# Patient Record
Sex: Male | Born: 1995 | Race: Black or African American | Hispanic: No | Marital: Single | Smoking: Current some day smoker
Health system: Southern US, Community
[De-identification: ages and names within clinical notes are randomized; demographics above are authoritative.]

## PROBLEM LIST (undated history)

## (undated) DIAGNOSIS — Z9109 Other allergy status, other than to drugs and biological substances: Secondary | ICD-10-CM

## (undated) DIAGNOSIS — J302 Other seasonal allergic rhinitis: Secondary | ICD-10-CM

## (undated) DIAGNOSIS — J45909 Unspecified asthma, uncomplicated: Secondary | ICD-10-CM

---
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
## 2017-03-20 ENCOUNTER — Encounter (HOSPITAL_COMMUNITY): Payer: Self-pay | Admitting: Family Medicine

## 2017-03-20 ENCOUNTER — Ambulatory Visit (HOSPITAL_COMMUNITY)
Admission: EM | Admit: 2017-03-20 | Discharge: 2017-03-20 | Disposition: A | Discharge status: Home / Self Care | Payer: Medicaid Other | Attending: Family Medicine | Admitting: Family Medicine

## 2017-03-20 DIAGNOSIS — L03112 Cellulitis of left axilla: Secondary | ICD-10-CM

## 2017-03-20 DIAGNOSIS — L02412 Cutaneous abscess of left axilla: Secondary | ICD-10-CM

## 2017-03-20 MED ORDER — SULFAMETHOXAZOLE-TRIMETHOPRIM 800-160 MG PO TABS: 2.0000 | ORAL_TABLET | Freq: Two times a day (BID) | ORAL | 0 refills | Status: AC

## 2017-03-20 NOTE — ED Provider Notes (Signed)
[facility]    CSN: [id number] Arrival date & time: 03/20/17  1000     History   Chief Complaint Chief Complaint  Patient presents with  . Abscess    HPI Stephen Pennington is a 21 y.o. male.    Patient is a 21 yo M who presents to urgent care with an L underarm abscess. States started off as a small bump in L underarm area 1 week ago that he thought was from irritation from his deodorant. He states he manually popped it at home and it drained a lot of pus at the time. However has become bigger and more painful over the last 4-5 days. No fever/chills, no nausea/vomiting. Otherwise has been doing well. Used to shave but has not in a long time.       Past Medical History:  Diagnosis Date  . Asthma   . Pollen allergies   . Seasonal allergies     Patient Active Problem List   Diagnosis Date Noted  . Viral syndrome 02/18/2013    History reviewed. No pertinent surgical history.     Home Medications    Prior to Admission medications   Medication Sig Start Date End Date Taking? Authorizing Provider  ondansetron (ZOFRAN ODT) 4 MG disintegrating tablet Take 1 tablet (4 mg total) by mouth every 8 (eight) hours as needed for nausea or vomiting. 08/24/13   Viviano Simasobinson, Lauren, NP    Family History History reviewed. No pertinent family history.  Social History Social History  Substance Use Topics  . Smoking status: Never Smoker  . Smokeless tobacco: Not on file  . Alcohol use No     Allergies   Patient has no known allergies.   Review of Systems Review of Systems  Constitutional: Negative for chills and fever.  Respiratory: Negative for shortness of breath.   Cardiovascular: Negative for chest pain.  Gastrointestinal: Negative for abdominal pain, diarrhea, nausea and vomiting.  Skin: Negative for rash.       Abscess in L underarm area     Physical Exam Triage Vital Signs ED Triage Vitals  Enc Vitals Group     BP 03/20/17 1033 118/63     Pulse  Rate 03/20/17 1033 77     Resp 03/20/17 1033 18     Temp 03/20/17 1033 98.6 F (37 C)     Temp src --      SpO2 03/20/17 1033 97 %     Weight --      Height --      Head Circumference --      Peak Flow --      Pain Score 03/20/17 1032 6     Pain Loc --      Pain Edu? --      Excl. in GC? --    No data found.   Updated Vital Signs BP 118/63   Pulse 77   Temp 98.6 F (37 C)   Resp 18   SpO2 97%   Visual Acuity Right Eye Distance:   Left Eye Distance:   Bilateral Distance:    Right Eye Near:   Left Eye Near:    Bilateral Near:     Physical Exam  Constitutional: He is oriented to person, place, and time. He appears well-developed and well-nourished. No distress.  HENT:  Head: Normocephalic and atraumatic.  Eyes: Conjunctivae are normal.  Pulmonary/Chest: Effort normal.  Musculoskeletal: Normal range of motion.  Neurological: He is alert and oriented to person,  place, and time.  Skin: Capillary refill takes less than 2 seconds.  L axilla with firm 3cm area of erythema and warmth, no area of fluctuance, central open draining wound  Psychiatric: He has a normal mood and affect.     UC Treatments / Results  Labs (all labs ordered are listed, but only abnormal results are displayed) Labs Reviewed - No data to display  EKG  EKG Interpretation None       Radiology No results found.  Procedures .Marland KitchenIncision and Drainage Date/Time: 03/20/2017 11:11 AM Performed by: Leland Her Authorized by: Mardella Layman   Consent:    Consent obtained:  Verbal   Consent given by:  Patient   Risks discussed:  Bleeding, incomplete drainage, infection and pain   Alternatives discussed:  No treatment Location:    Type:  Abscess   Size:  3cm   Location: L axilla. Pre-procedure details:    Skin preparation:  Betadine Anesthesia (see MAR for exact dosages):    Anesthesia method:  Local infiltration   Local anesthetic:  Lidocaine 1% w/o epi Procedure type:     Complexity:  Simple Procedure details:    Needle aspiration: no     Incision types:  Stab incision   Scalpel blade:  11   Wound management:  Probed and deloculated   Drainage:  Bloody   Drainage amount:  Scant   Wound treatment:  Wound left open   Packing materials:  None Post-procedure details:    Patient tolerance of procedure:  Tolerated well, no immediate complications   (including critical care time)  Medications Ordered in UC Medications - No data to display   Initial Impression / Assessment and Plan / UC Course  I have reviewed the triage vital signs and the nursing notes.  Pertinent labs & imaging results that were available during my care of the patient were reviewed by me and considered in my medical decision making (see chart for details).   Patient is a 21 yo M who presented with a L axilla abscess that he manually expressed at home. He has had no systemic symptoms and is afebrile and well appearing on exam. Abscess has area of surrounding cellulitis with erythema and warmth. Incision and drainage was attempted but only very scant bloody drainage was expressed. Given history of purulent discharge at home will treat with bactrim for surrounding cellulitis for 7 days. Patient instructed on care after at home and given return precautions including but not limited to poor healing, fever/chills, worsening infection. Patient voiced good understanding.  Final Clinical Impressions(s) / UC Diagnoses   Final diagnoses:  Abscess of left axilla  Cellulitis of left axilla    New Prescriptions New Prescriptions   SULFAMETHOXAZOLE-TRIMETHOPRIM (BACTRIM DS,SEPTRA DS) 800-160 MG TABLET    Take 2 tablets by mouth 2 (two) times daily.      Leland Her, DO 03/20/17 1116

## 2017-03-20 NOTE — Discharge Instructions (Signed)
Keep the area clean and dry, wash with soap and water. Take antibiotic bactrim twice a day for next 7 days. You can take over the counter tylenol or ibuprofen for pain.

## 2017-03-20 NOTE — ED Triage Notes (Signed)
Pt here for abscess under left axilla x 1 week. Open and draining.

## 2017-05-05 ENCOUNTER — Other Ambulatory Visit: Payer: Self-pay

## 2017-05-05 ENCOUNTER — Ambulatory Visit (HOSPITAL_COMMUNITY)
Admission: EM | Admit: 2017-05-05 | Discharge: 2017-05-05 | Disposition: A | Discharge status: Home / Self Care | Payer: Self-pay | Attending: Physician Assistant | Admitting: Physician Assistant

## 2017-05-05 ENCOUNTER — Encounter (HOSPITAL_COMMUNITY): Payer: Self-pay | Admitting: Emergency Medicine

## 2017-05-05 DIAGNOSIS — L0291 Cutaneous abscess, unspecified: Secondary | ICD-10-CM

## 2017-05-05 MED ORDER — SULFAMETHOXAZOLE-TRIMETHOPRIM 800-160 MG PO TABS: 1.0000 | ORAL_TABLET | Freq: Two times a day (BID) | ORAL | 0 refills | Status: AC

## 2017-05-05 MED ORDER — MUPIROCIN CALCIUM 2 % EX CREA: 1.0000 "application " | TOPICAL_CREAM | Freq: Two times a day (BID) | CUTANEOUS | 0 refills | Status: DC

## 2017-05-05 NOTE — ED Triage Notes (Signed)
Per pt he noticed a new boil on his upper left leg. Per pt he has another boil on his bottom, per pt he noticed the newest boil 3 days ago.

## 2017-05-05 NOTE — ED Provider Notes (Signed)
[facility]    CSN: [id number] Arrival date & time: 05/05/17  1238     History   Chief Complaint Chief Complaint  Patient presents with  . Recurrent Skin Infections    HPI Stephen Pennington is a 21 y.o. male.   21 year-old male, presenting today due to multiple skin lesions States he has noticed "white heads" to his right arm, leg leg and right buttock starting in the past several days. He attempted to pop the one on his left leg and now has surrounding redness. No fever or chills   The history is provided by the patient.  Rash  Location:  Shoulder/arm and leg Shoulder/arm rash location:  R forearm Leg rash location:  L upper leg Quality: painful and redness   Pain details:    Quality:  Aching   Severity:  Mild   Onset quality:  Gradual   Duration:  5 days   Timing:  Constant   Progression:  Unchanged Severity:  Mild Onset quality:  Gradual Duration:  5 days Timing:  Constant Progression:  Unchanged Chronicity:  New Context: not animal contact, not chemical exposure, not diapers, not eggs, not exposure to similar rash and not food   Relieved by:  Nothing Worsened by:  Nothing Ineffective treatments:  None tried Associated symptoms: no abdominal pain, no diarrhea, no fatigue, no fever, no headaches, no hoarse voice, no joint pain, no shortness of breath, no sore throat and not vomiting     Past Medical History:  Diagnosis Date  . Asthma   . Pollen allergies   . Seasonal allergies     Patient Active Problem List   Diagnosis Date Noted  . Viral syndrome 02/18/2013    History reviewed. No pertinent surgical history.     Home Medications    Prior to Admission medications   Medication Sig Start Date End Date Taking? Authorizing Provider  mupirocin cream (BACTROBAN) 2 % Apply 1 application topically 2 (two) times daily. 05/05/17   Puanani Gene C, PA-C  ondansetron (ZOFRAN ODT) 4 MG disintegrating tablet Take 1 tablet (4 mg total) by mouth every  8 (eight) hours as needed for nausea or vomiting. 08/24/13   Viviano Simasobinson, Lauren, NP  sulfamethoxazole-trimethoprim (BACTRIM DS,SEPTRA DS) 800-160 MG tablet Take 1 tablet by mouth 2 (two) times daily for 7 days. 05/05/17 05/12/17  Alecia LemmingBlue, Wilton Thrall C, PA-C    Family History History reviewed. No pertinent family history.  Social History Social History   Tobacco Use  . Smoking status: Never Smoker  . Smokeless tobacco: Never Used  Substance Use Topics  . Alcohol use: No  . Drug use: No     Allergies   Patient has no known allergies.   Review of Systems Review of Systems  Constitutional: Negative for chills, fatigue and fever.  HENT: Negative for ear pain, hoarse voice and sore throat.   Eyes: Negative for pain and visual disturbance.  Respiratory: Negative for cough and shortness of breath.   Cardiovascular: Negative for chest pain and palpitations.  Gastrointestinal: Negative for abdominal pain, diarrhea and vomiting.  Genitourinary: Negative for dysuria and hematuria.  Musculoskeletal: Negative for arthralgias and back pain.  Skin: Positive for rash and wound. Negative for color change.  Neurological: Negative for seizures, syncope and headaches.  All other systems reviewed and are negative.    Physical Exam Triage Vital Signs ED Triage Vitals  Enc Vitals Group     BP 05/05/17 1345 (!) 131/57     Pulse  Rate 05/05/17 1345 (!) 55     Resp --      Temp 05/05/17 1345 97.6 F (36.4 C)     Temp Source 05/05/17 1345 Oral     SpO2 05/05/17 1345 100 %     Weight --      Height --      Head Circumference --      Peak Flow --      Pain Score 05/05/17 1344 0     Pain Loc --      Pain Edu? --      Excl. in GC? --    No data found.  Updated Vital Signs BP (!) 131/57 (BP Location: Left Arm)   Pulse (!) 55   Temp 97.6 F (36.4 C) (Oral)   SpO2 100%   Visual Acuity Right Eye Distance:   Left Eye Distance:   Bilateral Distance:    Right Eye Near:   Left Eye Near:      Bilateral Near:     Physical Exam  Constitutional: He appears well-developed and well-nourished.  HENT:  Head: Normocephalic and atraumatic.  Eyes: Conjunctivae are normal.  Neck: Neck supple.  Cardiovascular: Normal rate and regular rhythm.  No murmur heard. Pulmonary/Chest: Effort normal and breath sounds normal. No respiratory distress.  Abdominal: Soft. There is no tenderness.  Musculoskeletal: He exhibits no edema.  Neurological: He is alert.  Skin: Skin is warm and dry.  Small erythematous ulcerations to the right ac fossa, left anterior thigh and right buttock Surrounding induration to the left thigh Otherwise, no evidence of underlying abscess    Psychiatric: He has a normal mood and affect.  Nursing note and vitals reviewed.    UC Treatments / Results  Labs (all labs ordered are listed, but only abnormal results are displayed) Labs Reviewed - No data to display  EKG  EKG Interpretation None       Radiology No results found.  Procedures Procedures (including critical care time)  Medications Ordered in UC Medications - No data to display   Initial Impression / Assessment and Plan / UC Course  I have reviewed the triage vital signs and the nursing notes.  Pertinent labs & imaging results that were available during my care of the patient were reviewed by me and considered in my medical decision making (see chart for details).     cutaneous abscess No abscess amenable to drainage Oral and topical antibiotics   Final Clinical Impressions(s) / UC Diagnoses   Final diagnoses:  Abscess    ED Discharge Orders        Ordered    sulfamethoxazole-trimethoprim (BACTRIM DS,SEPTRA DS) 800-160 MG tablet  2 times daily     05/05/17 1429    mupirocin cream (BACTROBAN) 2 %  2 times daily     05/05/17 1429       Controlled Substance Prescriptions [place] Controlled Substance Registry consulted? Not Applicable   Alecia LemmingBlue, Caramia Boutin C, [place]-C 05/05/17 1430

## 2018-02-08 ENCOUNTER — Other Ambulatory Visit: Payer: Self-pay

## 2018-02-08 ENCOUNTER — Emergency Department (HOSPITAL_COMMUNITY)
Admission: EM | Admit: 2018-02-08 | Discharge: 2018-02-08 | Disposition: A | Discharge status: Home / Self Care | Payer: No Typology Code available for payment source | Attending: Emergency Medicine | Admitting: Emergency Medicine

## 2018-02-08 ENCOUNTER — Encounter (HOSPITAL_COMMUNITY): Payer: Self-pay | Admitting: Emergency Medicine

## 2018-02-08 ENCOUNTER — Emergency Department (HOSPITAL_COMMUNITY): Payer: No Typology Code available for payment source

## 2018-02-08 DIAGNOSIS — J45909 Unspecified asthma, uncomplicated: Secondary | ICD-10-CM | POA: Insufficient documentation

## 2018-02-08 DIAGNOSIS — Z23 Encounter for immunization: Secondary | ICD-10-CM | POA: Diagnosis not present

## 2018-02-08 DIAGNOSIS — R1084 Generalized abdominal pain: Secondary | ICD-10-CM | POA: Insufficient documentation

## 2018-02-08 DIAGNOSIS — M542 Cervicalgia: Secondary | ICD-10-CM | POA: Insufficient documentation

## 2018-02-08 DIAGNOSIS — T148XXA Other injury of unspecified body region, initial encounter: Secondary | ICD-10-CM

## 2018-02-08 DIAGNOSIS — F17210 Nicotine dependence, cigarettes, uncomplicated: Secondary | ICD-10-CM | POA: Insufficient documentation

## 2018-02-08 LAB — CBC
HCT: 46.8 % (ref 39.0–52.0)
HEMOGLOBIN: 15.6 g/dL (ref 13.0–17.0)
MCH: 31.3 pg (ref 26.0–34.0)
MCHC: 33.3 g/dL (ref 30.0–36.0)
MCV: 93.8 fL (ref 78.0–100.0)
PLATELETS: 291 10*3/uL (ref 150–400)
RBC: 4.99 MIL/uL (ref 4.22–5.81)
RDW: 12.2 % (ref 11.5–15.5)
WBC: 14.5 10*3/uL — AB (ref 4.0–10.5)

## 2018-02-08 LAB — COMPREHENSIVE METABOLIC PANEL
ALT: 48 U/L — AB (ref 0–44)
ANION GAP: 19 — AB (ref 5–15)
AST: 36 U/L (ref 15–41)
Albumin: 4.1 g/dL (ref 3.5–5.0)
Alkaline Phosphatase: 73 U/L (ref 38–126)
BUN: 14 mg/dL (ref 6–20)
CALCIUM: 9.8 mg/dL (ref 8.9–10.3)
CHLORIDE: 104 mmol/L (ref 98–111)
CO2: 16 mmol/L — AB (ref 22–32)
CREATININE: 1.62 mg/dL — AB (ref 0.61–1.24)
GFR calc Af Amer: >60 mL/min (ref >60)
GFR, EST NON AFRICAN AMERICAN: 60 mL/min — AB (ref >60)
Glucose, Bld: 128 mg/dL — ABNORMAL HIGH (ref 70–99)
Potassium: 3.3 mmol/L — ABNORMAL LOW (ref 3.5–5.1)
SODIUM: 139 mmol/L (ref 135–145)
Total Bilirubin: 0.7 mg/dL (ref 0.3–1.2)
Total Protein: 7.3 g/dL (ref 6.5–8.1)

## 2018-02-08 LAB — I-STAT CHEM 8, ED
BUN: 14 mg/dL (ref 6–20)
CALCIUM ION: 1.2 mmol/L (ref 1.15–1.40)
CREATININE: 1.6 mg/dL — AB (ref 0.61–1.24)
Chloride: 107 mmol/L (ref 98–111)
GLUCOSE: 122 mg/dL — AB (ref 70–99)
HCT: 46 % (ref 39.0–52.0)
HEMOGLOBIN: 15.6 g/dL (ref 13.0–17.0)
Potassium: 3.2 mmol/L — ABNORMAL LOW (ref 3.5–5.1)
Sodium: 139 mmol/L (ref 135–145)
TCO2: 18 mmol/L — ABNORMAL LOW (ref 22–32)

## 2018-02-08 LAB — PROTIME-INR
INR: 0.95
PROTHROMBIN TIME: 12.6 s (ref 11.4–15.2)

## 2018-02-08 LAB — ETHANOL

## 2018-02-08 LAB — SAMPLE TO BLOOD BANK

## 2018-02-08 LAB — I-STAT CG4 LACTIC ACID, ED
Lactic Acid, Venous: 10.03 mmol/L (ref 0.5–1.9)
Lactic Acid, Venous: 2.28 mmol/L (ref 0.5–1.9)

## 2018-02-08 LAB — CDS SEROLOGY

## 2018-02-08 MED ORDER — SODIUM CHLORIDE 0.9 % IV SOLN
Freq: Once | INTRAVENOUS | Status: AC
  Administered 2018-02-08: 01:00:00 via INTRAVENOUS

## 2018-02-08 MED ORDER — KETOROLAC TROMETHAMINE 30 MG/ML IJ SOLN
30.0000 mg | Freq: Once | INTRAMUSCULAR | Status: AC
  Administered 2018-02-08: 30 mg via INTRAVENOUS
  Filled 2018-02-08: qty 1

## 2018-02-08 MED ORDER — SODIUM CHLORIDE 0.9 % IV BOLUS
2000.0000 mL | Freq: Once | INTRAVENOUS | Status: AC
  Administered 2018-02-08: 2000 mL via INTRAVENOUS

## 2018-02-08 MED ORDER — TETANUS-DIPHTH-ACELL PERTUSSIS 5-2.5-18.5 LF-MCG/0.5 IM SUSP
0.5000 mL | Freq: Once | INTRAMUSCULAR | Status: AC
  Administered 2018-02-08: 0.5 mL via INTRAMUSCULAR
  Filled 2018-02-08: qty 0.5

## 2018-02-08 MED ORDER — IOPAMIDOL (ISOVUE-300) INJECTION 61%
100.0000 mL | Freq: Once | INTRAVENOUS | Status: AC | PRN
  Administered 2018-02-08: 100 mL via INTRAVENOUS

## 2018-02-08 MED ORDER — DICLOFENAC SODIUM ER 100 MG PO TB24: 100.0000 mg | ORAL_TABLET | Freq: Every day | ORAL | 0 refills | Status: DC

## 2018-02-08 MED ORDER — CEFAZOLIN SODIUM-DEXTROSE 1-4 GM/50ML-% IV SOLN
1.0000 g | Freq: Once | INTRAVENOUS | Status: AC
  Administered 2018-02-08: 1 g via INTRAVENOUS
  Filled 2018-02-08: qty 50

## 2018-02-08 MED ORDER — NALOXONE HCL 2 MG/2ML IJ SOSY
1.0000 mg | PREFILLED_SYRINGE | Freq: Once | INTRAMUSCULAR | Status: AC
  Administered 2018-02-08: 1 mg via INTRAVENOUS

## 2018-02-08 NOTE — ED Notes (Signed)
Patient to CT.

## 2018-02-08 NOTE — ED Notes (Signed)
Dr Nicanor Alcon informed of lactic acid results 2.28

## 2018-02-08 NOTE — ED Notes (Signed)
Dr Nicanor Alcon informed of lactic acid results 10.03
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
## 2018-02-20 ENCOUNTER — Encounter (HOSPITAL_COMMUNITY): Payer: Self-pay | Admitting: Emergency Medicine

## 2018-10-10 ENCOUNTER — Encounter (HOSPITAL_COMMUNITY): Payer: Self-pay

## 2018-10-10 ENCOUNTER — Ambulatory Visit (HOSPITAL_COMMUNITY)
Admission: EM | Admit: 2018-10-10 | Discharge: 2018-10-10 | Disposition: A | Discharge status: Home / Self Care | Payer: Self-pay | Attending: Emergency Medicine | Admitting: Emergency Medicine

## 2018-10-10 ENCOUNTER — Other Ambulatory Visit: Payer: Self-pay

## 2018-10-10 ENCOUNTER — Ambulatory Visit (HOSPITAL_COMMUNITY): Admission: EM | Admit: 2018-10-10 | Discharge: 2018-10-10 | Discharge status: Against Medical Advice | Payer: Self-pay

## 2018-10-10 DIAGNOSIS — Z711 Person with feared health complaint in whom no diagnosis is made: Secondary | ICD-10-CM | POA: Insufficient documentation

## 2018-10-10 DIAGNOSIS — Z113 Encounter for screening for infections with a predominantly sexual mode of transmission: Secondary | ICD-10-CM

## 2018-10-10 DIAGNOSIS — Z202 Contact with and (suspected) exposure to infections with a predominantly sexual mode of transmission: Secondary | ICD-10-CM | POA: Insufficient documentation

## 2018-10-10 MED ORDER — METRONIDAZOLE 500 MG PO TABS
2000.0000 mg | ORAL_TABLET | Freq: Once | ORAL | Status: AC
  Administered 2018-10-10: 14:00:00 2000 mg via ORAL

## 2018-10-10 MED ORDER — METRONIDAZOLE 500 MG PO TABS
ORAL_TABLET | ORAL | Status: AC
  Filled 2018-10-10: qty 4

## 2018-10-10 NOTE — Discharge Instructions (Signed)
Urine cytology obtained  Declines HIV/ syphilis testing today Metronidazole given in office for possible trichomoniasis We will follow up with you regarding the results of your tests If tests are positive, please abstain from sexual activity for at least 7 days and notify partners Follow up with PCP or community health if symptoms persists Return here or go to ER if you have any new or worsening symptoms such as fever, chills, nausea, vomiting, abdominal or pelvic pain, penile rashes or lesions, testicular swelling or pain, etc..Marland Kitchen

## 2018-10-10 NOTE — ED Notes (Signed)
Pt stepped out to go get co pay from Home

## 2018-10-10 NOTE — ED Triage Notes (Signed)
Patient presents to Urgent Care with complaints of needing STD test since his girlfriend told him that she tested positive for trichomonas and was treated. Patient reports he is not having any symptoms.

## 2018-10-10 NOTE — ED Provider Notes (Signed)
[facility]   [id number] 10/10/18 Arrival Time: 1257   CC: CONCERN FOR STD  SUBJECTIVE:  Stephen Pennington is a 23 y.o. male who presents requesting STI screening.  Currently asymptomatic.  Partner diagnosed with trichomonas.  Last unprotected sexual encounter 1 week ago.  Sexually active with 1 male partner over the past 6 months.  Denies similar symptoms in the past.  Denies fever, chills, nausea, vomiting, abdominal or pelvic pain, penile rashes or lesions, testicular swelling or pain, dysuria, urethral discharge.      No LMP for male patient.  ROS: As per HPI.  Past Medical History:  Diagnosis Date  . Asthma   . Pollen allergies   . Seasonal allergies    History reviewed. No pertinent surgical history. No Known Allergies No current facility-administered medications on file prior to encounter.    No current outpatient medications on file prior to encounter.   Social History   Socioeconomic History  . Marital status: Single    Spouse name: Not on file  . Number of children: Not on file  . Years of education: Not on file  . Highest education level: Not on file  Occupational History  . Not on file  Social Needs  . Financial resource strain: Not on file  . Food insecurity:    Worry: Not on file    Inability: Not on file  . Transportation needs:    Medical: Not on file    Non-medical: Not on file  Tobacco Use  . Smoking status: Current Some Day Smoker  . Smokeless tobacco: Never Used  Substance and Sexual Activity  . Alcohol use: No  . Drug use: No  . Sexual activity: Not on file  Lifestyle  . Physical activity:    Days per week: Not on file    Minutes per session: Not on file  . Stress: Not on file  Relationships  . Social connections:    Talks on phone: Not on file    Gets together: Not on file    Attends religious service: Not on file    Active member of club or organization: Not on file    Attends meetings of clubs or organizations:  Not on file    Relationship status: Not on file  . Intimate partner violence:    Fear of current or ex partner: Not on file    Emotionally abused: Not on file    Physically abused: Not on file    Forced sexual activity: Not on file  Other Topics Concern  . Not on file  Social History Narrative   ** Merged History Encounter **       History reviewed. No pertinent family history.  OBJECTIVE:  Vitals:   10/10/18 1353  BP: (!) 142/80  Pulse: 69  Resp: 18  Temp: 98.3 F (36.8 C)  TempSrc: Oral  SpO2: 99%     General appearance: alert, NAD, appears stated age Head: NCAT Throat: lips, mucosa, and tongue normal; teeth and gums normal Lungs: CTA bilaterally without adventitious breath sounds Heart: regular rate and rhythm.  Radial pulses 2+ symmetrical bilaterally Back: no CVA tenderness Abdomen: soft, non-tender; bowel sounds normal; no masses or organomegaly; no guarding GU: deferred Skin: warm and dry Psychological:  Alert and cooperative. Normal mood and affect.  ASSESSMENT & PLAN:  1. STD exposure   2. Concern about STD in male without diagnosis     Meds ordered this encounter  Medications  . metroNIDAZOLE (FLAGYL) tablet 2,000  mg    Pending: Labs Reviewed  URINE CYTOLOGY ANCILLARY ONLY    Urine cytology obtained  Declines HIV/ syphilis testing today Metronidazole given in office for possible trichomoniasis We will follow up with you regarding the results of your tests If tests are positive, please abstain from sexual activity for at least 7 days and notify partners Follow up with PCP or community health if symptoms persists Return here or go to ER if you have any new or worsening symptoms such as fever, chills, nausea, vomiting, abdominal or pelvic pain, penile rashes or lesions, testicular swelling or pain, etc...    Reviewed expectations re: course of current medical issues. Questions answered. Outlined signs and symptoms indicating need for more acute  intervention. Patient verbalized understanding. After Visit Summary given.       Rennis HardingWurst, Kaimana Lurz, PA-C 10/10/18 1425

## 2018-10-13 ENCOUNTER — Telehealth (HOSPITAL_COMMUNITY): Payer: Self-pay | Admitting: Emergency Medicine

## 2018-10-13 LAB — URINE CYTOLOGY ANCILLARY ONLY
Chlamydia: NEGATIVE
Neisseria Gonorrhea: NEGATIVE
Trichomonas: POSITIVE — AB

## 2018-10-13 NOTE — Telephone Encounter (Signed)
Trichomonas is positive. Rx metronidazole was given at the urgent care visit. Pt needs education to please refrain from sexual intercourse for 7 days to give the medicine time to work. Sexual partners need to be notified and tested/treated. Condoms may reduce risk of reinfection. Recheck for further evaluation if symptoms are not improving.   Patient contacted and made aware of all results, all questions answered.   

## 2019-03-17 ENCOUNTER — Encounter (HOSPITAL_COMMUNITY): Payer: Self-pay | Admitting: Emergency Medicine

## 2019-03-17 ENCOUNTER — Other Ambulatory Visit: Payer: Self-pay

## 2019-03-17 ENCOUNTER — Ambulatory Visit (HOSPITAL_COMMUNITY)
Admission: EM | Admit: 2019-03-17 | Discharge: 2019-03-17 | Disposition: A | Discharge status: Home / Self Care | Payer: Self-pay | Attending: Emergency Medicine | Admitting: Emergency Medicine

## 2019-03-17 DIAGNOSIS — S0992XA Unspecified injury of nose, initial encounter: Secondary | ICD-10-CM

## 2019-03-17 DIAGNOSIS — S0031XA Abrasion of nose, initial encounter: Secondary | ICD-10-CM

## 2019-03-17 MED ORDER — IBUPROFEN 800 MG PO TABS: 800.0000 mg | ORAL_TABLET | Freq: Three times a day (TID) | ORAL | 0 refills | Status: AC

## 2019-03-17 MED ORDER — MUPIROCIN 2 % EX OINT: 1.0000 "application " | TOPICAL_OINTMENT | Freq: Two times a day (BID) | CUTANEOUS | 0 refills | Status: DC

## 2019-03-17 NOTE — ED Provider Notes (Signed)
[facility]    CSN: [id number] Arrival date & time: 03/17/19  3419      History   Chief Complaint Chief Complaint  Patient presents with  . Facial Injury    HPI Jeramey Bardia Wangerin is a 23 y.o. male history of asthma, allergies, presenting today for evaluation of injury to nose.  Patient states that Friday while at work he was accidentally hit in the nose with a dresser.  Over the past 3 to 4 days he has had a lot of pain associated with this, states that it is much improved today.  He is also had some crusting and bleeding from the nose.  States that his nose is felt more sensitive.  Denies difficulty breathing.  Has applied Carmex.  Denies close contacts with similar symptoms.  Denies bleeding into throat.  HPI  Past Medical History:  Diagnosis Date  . Asthma   . Pollen allergies   . Seasonal allergies     Patient Active Problem List   Diagnosis Date Noted  . Viral syndrome 02/18/2013    History reviewed. No pertinent surgical history.     Home Medications    Prior to Admission medications   Medication Sig Start Date End Date Taking? Authorizing Provider  ibuprofen (ADVIL) 800 MG tablet Take 1 tablet (800 mg total) by mouth 3 (three) times daily. 03/17/19   Dearia Wilmouth C, PA-C  mupirocin ointment (BACTROBAN) 2 % Apply 1 application topically 2 (two) times daily. 03/17/19   Hernando Reali, Elesa Hacker, PA-C    Family History History reviewed. No pertinent family history.  Social History Social History   Tobacco Use  . Smoking status: Current Some Day Smoker  . Smokeless tobacco: Never Used  Substance Use Topics  . Alcohol use: No  . Drug use: No     Allergies   Patient has no known allergies.   Review of Systems Review of Systems  Constitutional: Negative for activity change, appetite change, chills, fatigue and fever.  HENT: Positive for nosebleeds. Negative for congestion, ear pain, rhinorrhea, sinus pressure and trouble swallowing.    Eyes: Negative for discharge and redness.  Respiratory: Negative for cough, chest tightness and shortness of breath.   Cardiovascular: Negative for chest pain.  Gastrointestinal: Negative for abdominal pain, diarrhea, nausea and vomiting.  Musculoskeletal: Negative for myalgias.  Skin: Negative for rash.  Neurological: Negative for dizziness, light-headedness and headaches.     Physical Exam Triage Vital Signs ED Triage Vitals [03/17/19 1013]  Enc Vitals Group     BP (!) 123/50     Pulse Rate 66     Resp 12     Temp 98.6 F (37 C)     Temp Source Oral     SpO2 99 %     Weight      Height      Head Circumference      Peak Flow      Pain Score 4     Pain Loc      Pain Edu?      Excl. in [place]?    No data found.  Updated Vital Signs BP (!) 123/50 (BP Location: Right Arm)   Pulse 66   Temp 98.6 F (37 C) (Oral)   Resp 12   SpO2 99%   Visual Acuity Right Eye Distance:   Left Eye Distance:   Bilateral Distance:    Right Eye Near:   Left Eye Near:    Bilateral Near:  Physical Exam Vitals signs and nursing note reviewed.  Constitutional:      Appearance: He is well-developed.     Comments: No acute distress  HENT:     Head: Normocephalic and atraumatic.     Comments: Nontender to palpation around orbits    Nose: Nose normal.     Comments: Nontender to palpation over nasal bridge, nontender to palpation over left cartilage at nasal tip  Yellow crusting with slight pink skin noted to bilateral nares openings, no obvious bleeding noted    Mouth/Throat:     Comments: Oral mucosa pink and moist, no tonsillar enlargement or exudate. Posterior pharynx patent and nonerythematous, no uvula deviation or swelling. Normal phonation.  Eyes:     Extraocular Movements: Extraocular movements intact.     Conjunctiva/sclera: Conjunctivae normal.     Pupils: Pupils are equal, round, and reactive to light.  Neck:     Musculoskeletal: Neck supple.  Cardiovascular:     Rate  and Rhythm: Normal rate.  Pulmonary:     Effort: Pulmonary effort is normal. No respiratory distress.  Abdominal:     General: There is no distension.  Musculoskeletal: Normal range of motion.  Skin:    General: Skin is warm and dry.  Neurological:     Mental Status: He is alert and oriented to person, place, and time.      UC Treatments / Results  Labs (all labs ordered are listed, but only abnormal results are displayed) Labs Reviewed - No data to display  EKG   Radiology No results found.  Procedures Procedures (including critical care time)  Medications Ordered in UC Medications - No data to display  Initial Impression / Assessment and Plan / UC Course  I have reviewed the triage vital signs and the nursing notes.  Pertinent labs & imaging results that were available during my care of the patient were reviewed by me and considered in my medical decision making (see chart for details).    Patient with nasal injury, not concerning for nasal fracture at this time, nontender to touch.  No associated bruising.  Appears to have abrasion to nares openings, will provide Bactroban to apply topically to prevent infection, there is yellow crusting's which could potentially make this concerning for impetigo.  No surrounding erythema or swelling at this time, deferring oral antibiotics.  Anti-inflammatories and ice for further pain as needed  Discussed strict return precautions. Patient verbalized understanding and is agreeable with plan.  Final Clinical Impressions(s) / UC Diagnoses   Final diagnoses:  Injury of nose, initial encounter  Abrasion of nose, initial encounter     Discharge Instructions     Apply bactroban twice daily to abrasion in nose Use anti-inflammatories for pain/swelling. You may take up to 800 mg Ibuprofen every 8 hours with food. You may supplement Ibuprofen with Tylenol [phone] mg every 8 hours.   Follow up if developing increased redness,  swelling, pain or drainage   ED Prescriptions    Medication Sig Dispense Auth. Provider   mupirocin ointment (BACTROBAN) 2 % Apply 1 application topically 2 (two) times daily. 30 g Albie Bazin C, PA-C   ibuprofen (ADVIL) 800 MG tablet Take 1 tablet (800 mg total) by mouth 3 (three) times daily. 21 tablet Yariela Tison, Stickney C, PA-C     PDMP not reviewed this encounter.   Elex Mainwaring, [place] C, PA-C 03/17/19 1059

## 2019-03-17 NOTE — ED Triage Notes (Signed)
Pt states he had a dresser fall on his face at work on Friday.  He reports that it hit just below the bridge of his nose.  Pt has had some bleeding and a lot of pain.  Pt sounds congested and he says it has been really stiff and he has "crusted" blood in his nose all the time.

## 2019-03-17 NOTE — Discharge Instructions (Addendum)
Apply bactroban twice daily to abrasion in nose Use anti-inflammatories for pain/swelling. You may take up to 800 mg Ibuprofen every 8 hours with food. You may supplement Ibuprofen with Tylenol [phone] mg every 8 hours.   Follow up if developing increased redness, swelling, pain or drainage

## 2019-05-25 ENCOUNTER — Emergency Department (HOSPITAL_COMMUNITY)
Admission: EM | Admit: 2019-05-25 | Discharge: 2019-05-25 | Disposition: A | Discharge status: Home / Self Care | Payer: Self-pay | Attending: Emergency Medicine | Admitting: Emergency Medicine

## 2019-05-25 ENCOUNTER — Encounter (HOSPITAL_COMMUNITY): Payer: Self-pay | Admitting: Emergency Medicine

## 2019-05-25 ENCOUNTER — Other Ambulatory Visit: Payer: Self-pay

## 2019-05-25 ENCOUNTER — Emergency Department (HOSPITAL_COMMUNITY): Payer: Self-pay

## 2019-05-25 DIAGNOSIS — F17200 Nicotine dependence, unspecified, uncomplicated: Secondary | ICD-10-CM | POA: Insufficient documentation

## 2019-05-25 DIAGNOSIS — J45909 Unspecified asthma, uncomplicated: Secondary | ICD-10-CM | POA: Insufficient documentation

## 2019-05-25 DIAGNOSIS — M25512 Pain in left shoulder: Secondary | ICD-10-CM | POA: Insufficient documentation

## 2019-05-25 DIAGNOSIS — W228XXA Striking against or struck by other objects, initial encounter: Secondary | ICD-10-CM | POA: Insufficient documentation

## 2019-05-25 MED ORDER — NAPROXEN 250 MG PO TABS
500.0000 mg | ORAL_TABLET | Freq: Once | ORAL | Status: AC
  Administered 2019-05-25: 500 mg via ORAL
  Filled 2019-05-25: qty 2

## 2019-05-25 NOTE — Discharge Instructions (Addendum)
You were evaluated in the Emergency Department and after careful evaluation, we did not find any emergent condition requiring admission or further testing in the hospital.  Your exam/testing today is overall reassuring.  Your x-ray did not show any broken bones.  We suspect that you have strained or sprained your rotator cuff.  Please rest the shoulder for the next 1 to 2 weeks and use Tylenol or Motrin for discomfort.  If your shoulder is still bothering you after 2 weeks, we recommend follow-up with an orthopedist.  Please return to the Emergency Department if you experience any worsening of your condition.  We encourage you to follow up with a primary care provider.  Thank you for allowing [place] to be a part of your care.

## 2019-05-25 NOTE — ED Provider Notes (Signed)
[facility] Emergency Department Provider Note MRN:  [id number]  Arrival date & time: 05/25/19     Chief Complaint   Shoulder Pain   History of Present Illness   Stephen Pennington is a 23 y.o. year-old male with a history of asthma presenting to the ED with chief complaint of shoulder pain.  Left shoulder pain after running into a door last night.  Pain is worse with motion, moderate in severity.  Denies head trauma, no loss of consciousness, no neck or back pain, no chest pain or shortness of breath, no abdominal pain.  Review of Systems  A complete 10 system review of systems was obtained and all systems are negative except as noted in the HPI and PMH.   Patient's Health History    Past Medical History:  Diagnosis Date  . Asthma   . Pollen allergies   . Seasonal allergies     History reviewed. No pertinent surgical history.  No family history on file.  Social History   Socioeconomic History  . Marital status: Single    Spouse name: Not on file  . Number of children: Not on file  . Years of education: Not on file  . Highest education level: Not on file  Occupational History  . Not on file  Tobacco Use  . Smoking status: Current Some Day Smoker  . Smokeless tobacco: Never Used  Substance and Sexual Activity  . Alcohol use: No  . Drug use: No  . Sexual activity: Not on file  Other Topics Concern  . Not on file  Social History Narrative   ** Merged History Encounter **       Social Determinants of Health   Financial Resource Strain:   . Difficulty of Paying Living Expenses: Not on file  Food Insecurity:   . Worried About Charity fundraiser in the Last Year: Not on file  . Ran Out of Food in the Last Year: Not on file  Transportation Needs:   . Lack of Transportation (Medical): Not on file  . Lack of Transportation (Non-Medical): Not on file  Physical Activity:   . Days of Exercise per Week: Not on file  . Minutes of Exercise  per Session: Not on file  Stress:   . Feeling of Stress : Not on file  Social Connections:   . Frequency of Communication with Friends and Family: Not on file  . Frequency of Social Gatherings with Friends and Family: Not on file  . Attends Religious Services: Not on file  . Active Member of Clubs or Organizations: Not on file  . Attends Archivist Meetings: Not on file  . Marital Status: Not on file  Intimate Partner Violence:   . Fear of Current or Ex-Partner: Not on file  . Emotionally Abused: Not on file  . Physically Abused: Not on file  . Sexually Abused: Not on file     Physical Exam  Vital Signs and Nursing Notes reviewed Vitals:   05/25/19 1437  BP: (!) 143/66  Pulse: (!) 54  Resp: 16  Temp: 98.5 F (36.9 C)  SpO2: 100%    CONSTITUTIONAL: Well-appearing, NAD NEURO:  Alert and oriented x 3, no focal deficits EYES:  eyes equal and reactive ENT/NECK:  no LAD, no JVD CARDIO: Regular rate, well-perfused, normal S1 and S2 PULM:  CTAB no wheezing or rhonchi GI/GU:  normal bowel sounds, non-distended, non-tender MSK/SPINE:  No gross deformities, no edema, left shoulder preserved range  of motion, neurovascularly distally, pain elicited with thumbs down test SKIN:  no rash, atraumatic PSYCH:  Appropriate speech and behavior  Diagnostic and Interventional Summary    EKG Interpretation  Date/Time:    Ventricular Rate:    PR Interval:    QRS Duration:   QT Interval:    QTC Calculation:   R Axis:     Text Interpretation:        Labs Reviewed - No data to display  DG Shoulder Left  Final Result      Medications  naproxen (NAPROSYN) tablet 500 mg (has no administration in time range)     Procedures  /  Critical Care Procedures  ED Course and Medical Decision Making  I have reviewed the triage vital signs and the nursing notes.  Pertinent labs & imaging results that were available during my care of the patient were reviewed by me and considered  in my medical decision making (see below for details).     Suspect rotator cuff strain or sprain, x-ray negative, appropriate for discharge.    Stephen Sow. Pilar Plate, MD [facility] Emergency Medicine [facility] [email or web address]  Final Clinical Impressions(s) / ED Diagnoses     ICD-10-CM   1. Acute pain of left shoulder  M25.512     ED Discharge Orders    None       Discharge Instructions Discussed with and Provided to Patient:     Discharge Instructions     You were evaluated in the Emergency Department and after careful evaluation, we did not find any emergent condition requiring admission or further testing in the hospital.  Your exam/testing today is overall reassuring.  Your x-ray did not show any broken bones.  We suspect that you have strained or sprained your rotator cuff.  Please rest the shoulder for the next 1 to 2 weeks and use Tylenol or Motrin for discomfort.  If your shoulder is still bothering you after 2 weeks, we recommend follow-up with an orthopedist.  Please return to the Emergency Department if you experience any worsening of your condition.  We encourage you to follow up with a primary care provider.  Thank you for allowing [place] to be a part of your care.      Sabas Sous, MD 05/25/19 [phone]

## 2019-05-25 NOTE — ED Triage Notes (Signed)
Pt reports getting his arm stuck in the door last night and now has shoulder pain. States it feels like bones are rubbing.
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
## 2020-06-28 ENCOUNTER — Ambulatory Visit (HOSPITAL_COMMUNITY)
Admission: EM | Admit: 2020-06-28 | Discharge: 2020-06-28 | Disposition: A | Discharge status: Home / Self Care | Payer: Self-pay | Attending: Family Medicine | Admitting: Family Medicine

## 2020-06-28 ENCOUNTER — Other Ambulatory Visit: Payer: Self-pay

## 2020-06-28 ENCOUNTER — Encounter (HOSPITAL_COMMUNITY): Payer: Self-pay | Admitting: Emergency Medicine

## 2020-06-28 DIAGNOSIS — Z202 Contact with and (suspected) exposure to infections with a predominantly sexual mode of transmission: Secondary | ICD-10-CM
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
## 2020-06-28 NOTE — ED Provider Notes (Signed)
[facility]    CSN: [id number] Arrival date & time: 06/28/20  1619      History   Chief Complaint Chief Complaint  Patient presents with  . Exposure to STD    HPI Stephen Pennington is a 25 y.o. male.   Here today requesting STI testing due to a potential exposure to some sort of STI (does not know which). Asymptomatic, including no penile discharge, rashes, dysuria, pelvic pain, N/V, fever.      Past Medical History:  Diagnosis Date  . Asthma   . Pollen allergies   . Seasonal allergies     Patient Active Problem List   Diagnosis Date Noted  . Viral syndrome 02/18/2013    History reviewed. No pertinent surgical history.     Home Medications    Prior to Admission medications   Medication Sig Start Date End Date Taking? Authorizing Provider  ibuprofen (ADVIL) 800 MG tablet Take 1 tablet (800 mg total) by mouth 3 (three) times daily. 03/17/19   Wieters, Hallie C, PA-C  mupirocin ointment (BACTROBAN) 2 % Apply 1 application topically 2 (two) times daily. 03/17/19   Wieters, Junius Creamer, PA-C    Family History History reviewed. No pertinent family history.  Social History Social History   Tobacco Use  . Smoking status: Current Some Day Smoker  . Smokeless tobacco: Never Used  Vaping Use  . Vaping Use: Never used  Substance Use Topics  . Alcohol use: No  . Drug use: No     Allergies   Patient has no known allergies.   Review of Systems Review of Systems PER HPI   Physical Exam Triage Vital Signs ED Triage Vitals  Enc Vitals Group     BP 06/28/20 1722 (!) 142/83     Pulse Rate 06/28/20 1722 71     Resp 06/28/20 1722 16     Temp 06/28/20 1722 98.3 F (36.8 C)     Temp Source 06/28/20 1722 Oral     SpO2 06/28/20 1722 96 %     Weight --      Height --      Head Circumference --      Peak Flow --      Pain Score 06/28/20 1720 0     Pain Loc --      Pain Edu? --      Excl. in GC? --    No data found.  Updated Vital  Signs BP (!) 142/83 (BP Location: Left Arm)   Pulse 71   Temp 98.3 F (36.8 C) (Oral)   Resp 16   SpO2 96%   Visual Acuity Right Eye Distance:   Left Eye Distance:   Bilateral Distance:    Right Eye Near:   Left Eye Near:    Bilateral Near:     Physical Exam Vitals and nursing note reviewed.  Constitutional:      Appearance: Normal appearance.  HENT:     Head: Atraumatic.  Eyes:     Extraocular Movements: Extraocular movements intact.     Conjunctiva/sclera: Conjunctivae normal.  Cardiovascular:     Rate and Rhythm: Normal rate and regular rhythm.  Pulmonary:     Effort: Pulmonary effort is normal.     Breath sounds: Normal breath sounds.  Genitourinary:    Comments: GU exam deferred, self swab performed Musculoskeletal:        General: Normal range of motion.     Cervical back: Normal range of motion and neck  supple.  Skin:    General: Skin is warm and dry.  Neurological:     General: No focal deficit present.     Mental Status: He is oriented to person, place, and time.  Psychiatric:        Mood and Affect: Mood normal.        Thought Content: Thought content normal.        Judgment: Judgment normal.      UC Treatments / Results  Labs (all labs ordered are listed, but only abnormal results are displayed) Labs Reviewed  CYTOLOGY, (ORAL, ANAL, URETHRAL) ANCILLARY ONLY    EKG   Radiology No results found.  Procedures Procedures (including critical care time)  Medications Ordered in UC Medications - No data to display  Initial Impression / Assessment and Plan / UC Course  I have reviewed the triage vital signs and the nursing notes.  Pertinent labs & imaging results that were available during my care of the patient were reviewed by me and considered in my medical decision making (see chart for details).     Cytology swab pending, abstinence until results return. Treat based on swab results.   Final Clinical Impressions(s) / UC Diagnoses    Final diagnoses:  STD exposure   Discharge Instructions   None    ED Prescriptions    None     PDMP not reviewed this encounter.   Particia Nearing, [place] 06/28/20 1811

## 2020-06-29 ENCOUNTER — Telehealth (HOSPITAL_COMMUNITY): Payer: Self-pay | Admitting: Emergency Medicine

## 2020-06-29 LAB — CYTOLOGY, (ORAL, ANAL, URETHRAL) ANCILLARY ONLY
Chlamydia: NEGATIVE
Comment: NEGATIVE
Comment: NEGATIVE
Comment: NORMAL
Neisseria Gonorrhea: NEGATIVE
Trichomonas: POSITIVE — AB

## 2020-06-29 MED ORDER — METRONIDAZOLE 500 MG PO TABS: 500.0000 mg | ORAL_TABLET | Freq: Two times a day (BID) | ORAL | 0 refills | Status: DC

## 2020-09-12 ENCOUNTER — Emergency Department (HOSPITAL_COMMUNITY)
Admission: EM | Admit: 2020-09-12 | Discharge: 2020-09-13 | Disposition: A | Discharge status: Home / Self Care | Payer: Self-pay | Attending: Emergency Medicine | Admitting: Emergency Medicine

## 2020-09-12 ENCOUNTER — Encounter (HOSPITAL_COMMUNITY): Payer: Self-pay | Admitting: Emergency Medicine

## 2020-09-12 ENCOUNTER — Other Ambulatory Visit: Payer: Self-pay

## 2020-09-12 DIAGNOSIS — J45909 Unspecified asthma, uncomplicated: Secondary | ICD-10-CM | POA: Insufficient documentation

## 2020-09-12 DIAGNOSIS — U071 COVID-19: Secondary | ICD-10-CM | POA: Insufficient documentation

## 2020-09-12 DIAGNOSIS — B349 Viral infection, unspecified: Secondary | ICD-10-CM

## 2020-09-12 DIAGNOSIS — Z28310 Unvaccinated for covid-19: Secondary | ICD-10-CM | POA: Insufficient documentation

## 2020-09-12 DIAGNOSIS — R509 Fever, unspecified: Secondary | ICD-10-CM

## 2020-09-12 DIAGNOSIS — F17200 Nicotine dependence, unspecified, uncomplicated: Secondary | ICD-10-CM | POA: Insufficient documentation

## 2020-09-12 MED ORDER — ONDANSETRON 4 MG PO TBDP
4.0000 mg | ORAL_TABLET | Freq: Once | ORAL | Status: AC
  Administered 2020-09-13: 4 mg via ORAL
  Filled 2020-09-12: qty 1

## 2020-09-12 MED ORDER — ACETAMINOPHEN 500 MG PO TABS
1000.0000 mg | ORAL_TABLET | Freq: Once | ORAL | Status: AC
  Administered 2020-09-13: 1000 mg via ORAL
  Filled 2020-09-12: qty 2

## 2020-09-12 MED ORDER — ONDANSETRON 4 MG PO TBDP: 4.0000 mg | ORAL_TABLET | Freq: Three times a day (TID) | ORAL | 0 refills | Status: DC | PRN

## 2020-09-12 NOTE — ED Triage Notes (Signed)
Patient comes from home. Patient is complaining of fever and cough.

## 2020-09-12 NOTE — ED Provider Notes (Signed)
[facility]-EMERGENCY DEPT Provider Note   CSN: [id number] Arrival date & time: 09/12/20  2311     History Chief Complaint  Patient presents with  . Fever    Stephen Pennington is a 25 y.o. male.  The history is provided by the patient and medical records.  Fever  Stephen Pennington is a 25 y.o. male who presents to the Emergency Department complaining of fever. He presents the emergency department complaining of fever to 103 that started earlier today. He has associated headache, nausea, vomiting, cough. No known sick contacts. He denies any sore throat, abdominal pain, diarrhea. He has no known medical problems and takes no medications. He is not vaccinated for COVID-19. He took ibuprofen earlier today, which she thinks made his headaches worse but did help his fever.    Past Medical History:  Diagnosis Date  . Asthma   . Pollen allergies   . Seasonal allergies     Patient Active Problem List   Diagnosis Date Noted  . Viral syndrome 02/18/2013    History reviewed. No pertinent surgical history.     History reviewed. No pertinent family history.  Social History   Tobacco Use  . Smoking status: Current Some Day Smoker  . Smokeless tobacco: Never Used  Vaping Use  . Vaping Use: Never used  Substance Use Topics  . Alcohol use: No  . Drug use: No    Home Medications Prior to Admission medications   Medication Sig Start Date End Date Taking? Authorizing Provider  ondansetron (ZOFRAN ODT) 4 MG disintegrating tablet Take 1 tablet (4 mg total) by mouth every 8 (eight) hours as needed for nausea or vomiting. 09/12/20  Yes Tilden Fossa, MD  ibuprofen (ADVIL) 800 MG tablet Take 1 tablet (800 mg total) by mouth 3 (three) times daily. 03/17/19   Wieters, Hallie C, PA-C  metroNIDAZOLE (FLAGYL) 500 MG tablet Take 1 tablet (500 mg total) by mouth 2 (two) times daily. 06/29/20   LampteyBritta Mccreedy, MD  mupirocin ointment (BACTROBAN) 2 % Apply 1  application topically 2 (two) times daily. 03/17/19   Wieters, Hallie C, PA-C    Allergies    Patient has no known allergies.  Review of Systems   Review of Systems  Constitutional: Positive for fever.  All other systems reviewed and are negative.   Physical Exam Updated Vital Signs BP (!) 159/85 (BP Location: Right Arm)   Pulse 89   Temp 99.7 F (37.6 C) (Oral)   Resp 17   Ht 5\' 7"  (1.702 m)   Wt 99.8 kg   SpO2 99%   BMI 34.46 kg/m   Physical Exam Vitals and nursing note reviewed.  Constitutional:      Appearance: He is well-developed.  HENT:     Head: Normocephalic and atraumatic.     Comments: TMs obscured by cerumen bilaterally Cardiovascular:     Rate and Rhythm: Normal rate and regular rhythm.     Heart sounds: No murmur heard.   Pulmonary:     Effort: Pulmonary effort is normal. No respiratory distress.     Breath sounds: Normal breath sounds.  Abdominal:     Palpations: Abdomen is soft.     Tenderness: There is no abdominal tenderness. There is no guarding or rebound.  Musculoskeletal:        General: No tenderness.     Cervical back: Neck supple.  Skin:    General: Skin is warm and dry.  Neurological:  Mental Status: He is alert and oriented to person, place, and time.  Psychiatric:        Behavior: Behavior normal.     ED Results / Procedures / Treatments   Labs (all labs ordered are listed, but only abnormal results are displayed) Labs Reviewed  SARS CORONAVIRUS 2 (TAT 6-24 HRS)    EKG None  Radiology No results found.  Procedures Procedures   Medications Ordered in ED Medications  acetaminophen (TYLENOL) tablet 1,000 mg (has no administration in time range)  ondansetron (ZOFRAN-ODT) disintegrating tablet 4 mg (has no administration in time range)    ED Course  I have reviewed the triage vital signs and the nursing notes.  Pertinent labs & imaging results that were available during my care of the patient were reviewed by me  and considered in my medical decision making (see chart for details).    MDM Rules/Calculators/A&P                         patient here for evaluation of fever that started earlier today. He is non-toxic appearing on evaluation with no respiratory distress. Presentation is not consistent with pneumonia, serious bacterial infection, acute abdomen. Discussed with patient home care for fever, likely viral illness. Will send COVID-19 test. Discussed home care, outpatient follow-up and return precautions.  Final Clinical Impression(s) / ED Diagnoses Final diagnoses:  Febrile illness  Viral illness    Rx / DC Orders ED Discharge Orders         Ordered    ondansetron (ZOFRAN ODT) 4 MG disintegrating tablet  Every 8 hours PRN        09/12/20 2341           Tilden Fossa, MD 09/12/20 2346

## 2020-09-13 LAB — SARS CORONAVIRUS 2 (TAT 6-24 HRS): SARS Coronavirus 2: POSITIVE — AB

## 2020-09-14 ENCOUNTER — Telehealth: Payer: Self-pay

## 2020-09-14 NOTE — Telephone Encounter (Signed)
Called to discuss with patient about COVID-19 symptoms and the use of one of the available treatments for those with mild to moderate Covid symptoms and at a high risk of hospitalization.  Pt appears to qualify for outpatient treatment due to co-morbid conditions and/or a member of an at-risk group in accordance with the FDA Emergency Use Authorization.    Symptom onset: 09/12/20 Vaccinated: Unknown Booster? Unknown Immunocompromised? No Qualifiers: ?  Pt. Disconnected call from nurse.  Esther Hardy

## 2020-09-25 IMAGING — DX DG SHOULDER 2+V*L*
3 series · 3 of 3 positions shown · non-contrast
Comparison: None.

CLINICAL DATA: Left shoulder pain after getting his arm stuck in a
door last night.

EXAM:
LEFT SHOULDER - 2+ VIEW

[shoulder grashey]
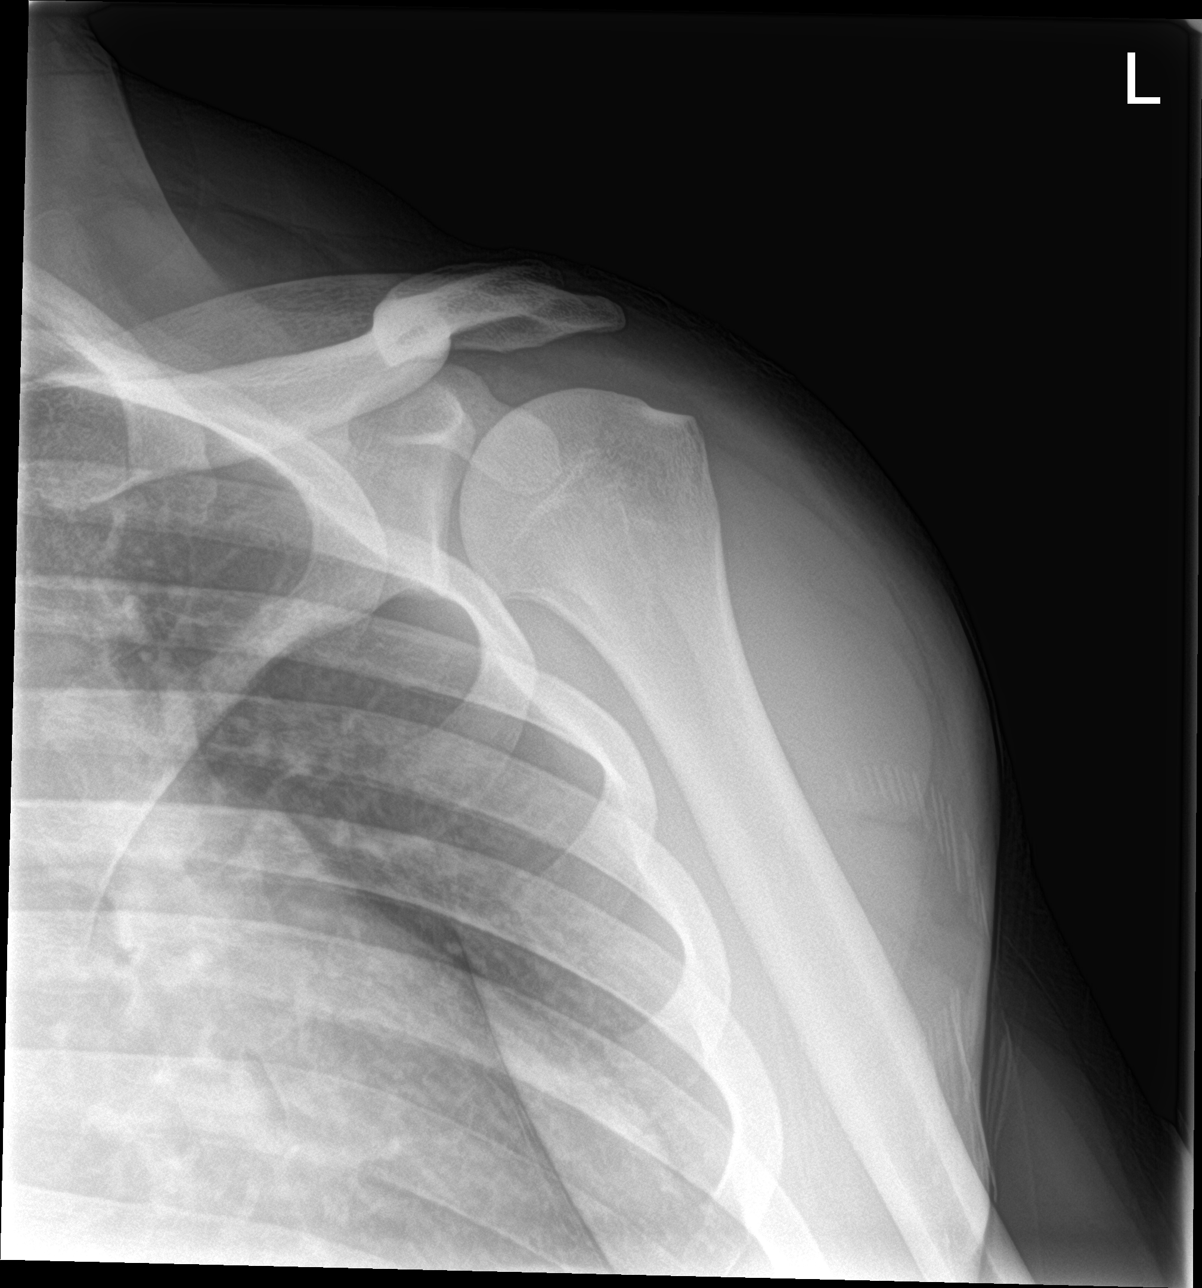

[shoulder y view]
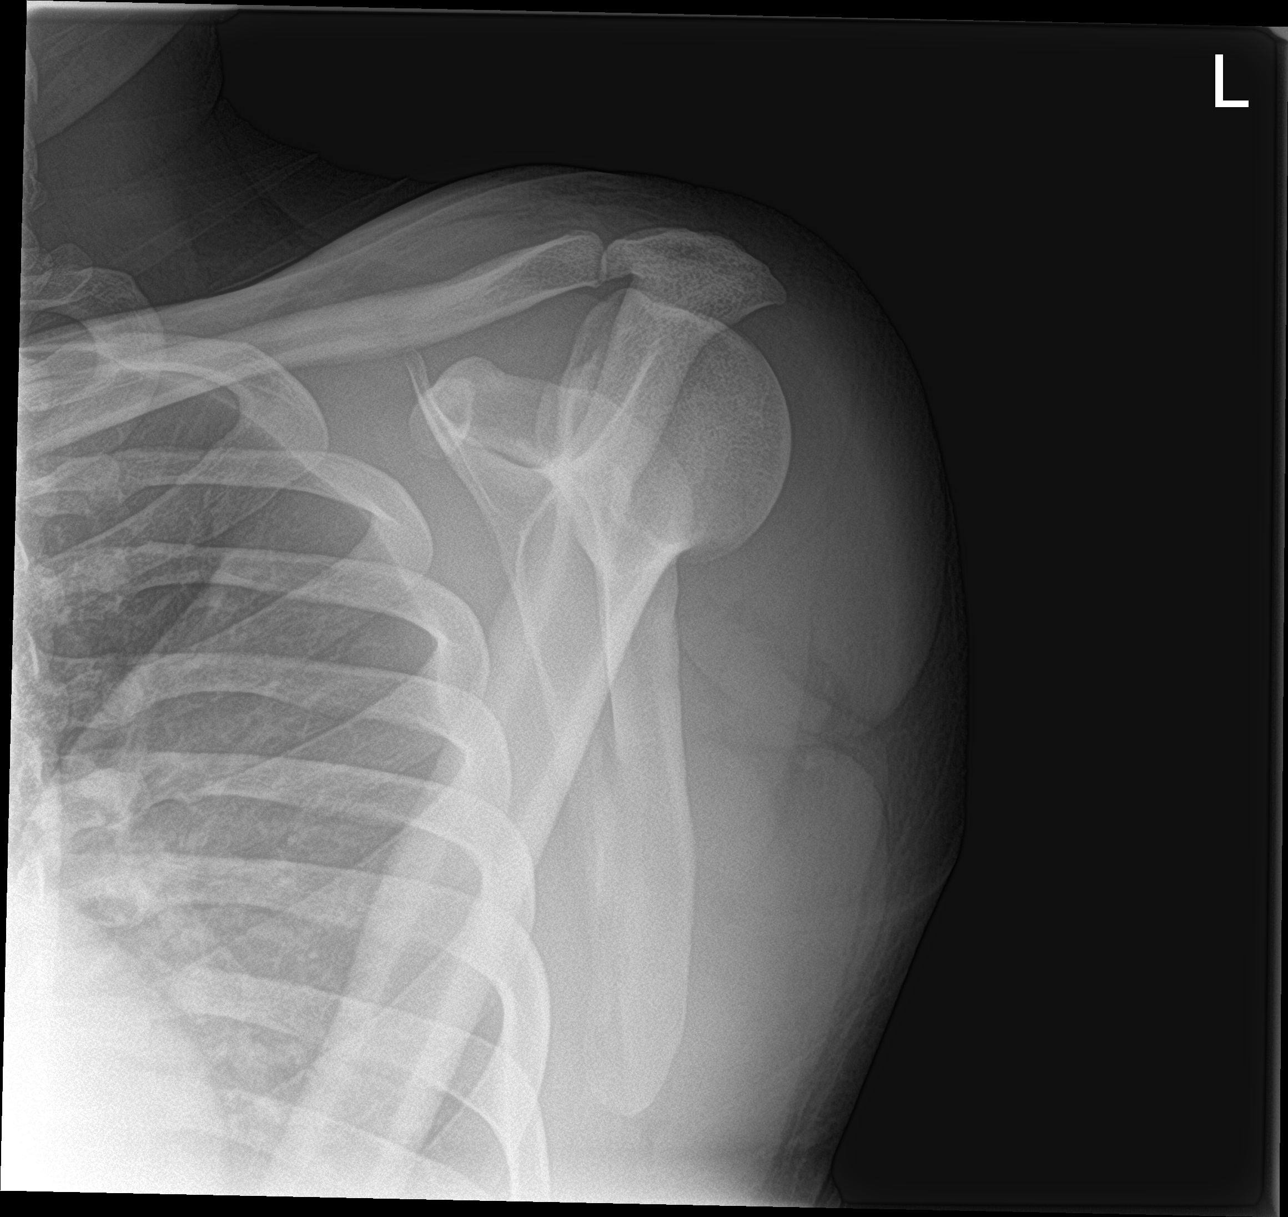

[shoulder ap neutral]
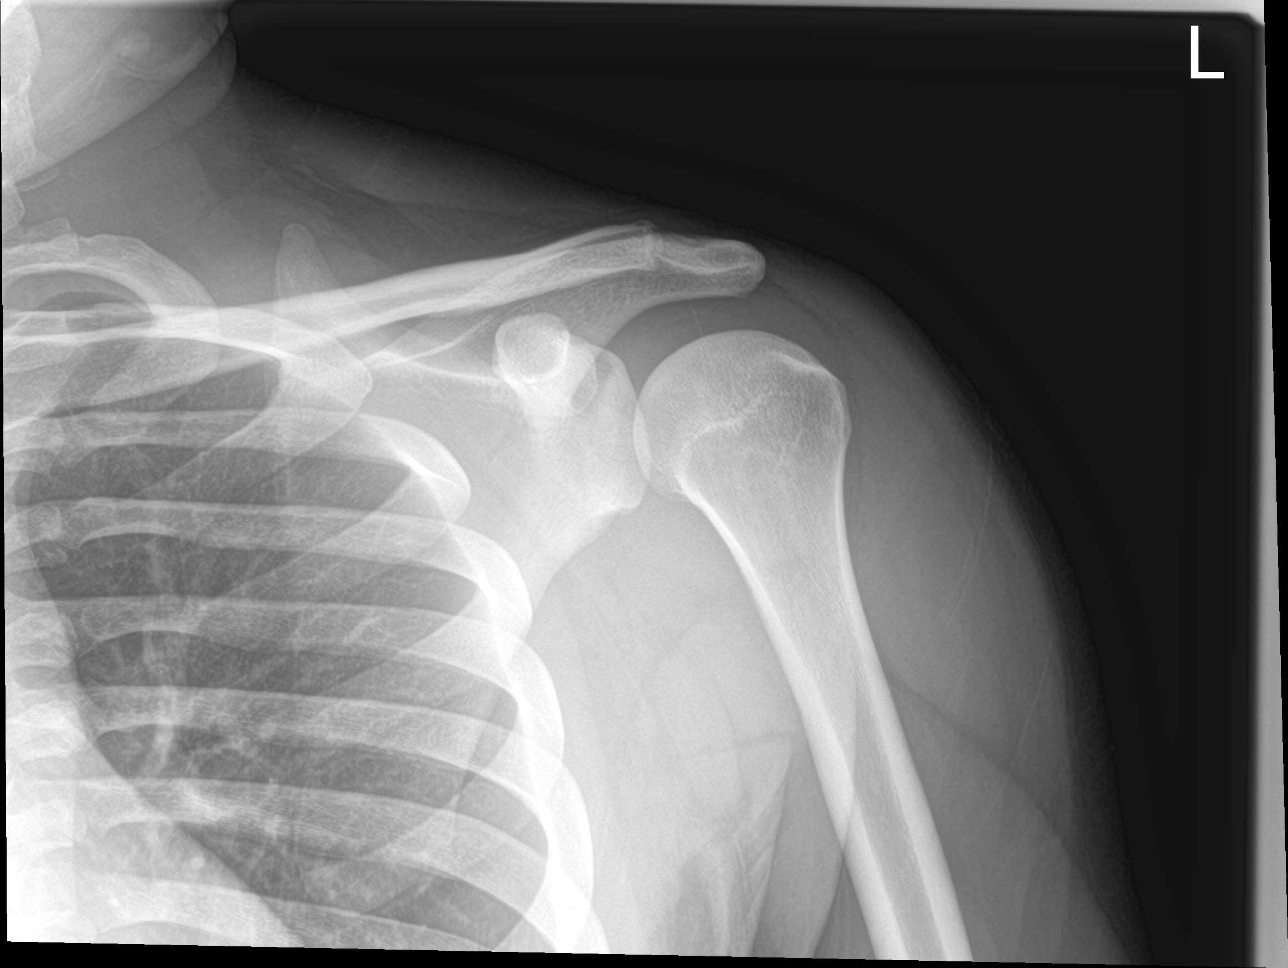

[3 of 3 positions shown; findings below may reference images not displayed]

FINDINGS: There is no evidence of fracture or dislocation. There is no
evidence of arthropathy or other focal bone abnormality. Soft
tissues are unremarkable.
IMPRESSION: Normal examination.

## 2021-10-05 ENCOUNTER — Ambulatory Visit (HOSPITAL_COMMUNITY)
Admission: EM | Admit: 2021-10-05 | Discharge: 2021-10-05 | Disposition: A | Discharge status: Home / Self Care | Payer: Self-pay | Attending: Family Medicine | Admitting: Family Medicine

## 2021-10-05 ENCOUNTER — Encounter (HOSPITAL_COMMUNITY): Payer: Self-pay

## 2021-10-05 DIAGNOSIS — R0981 Nasal congestion: Secondary | ICD-10-CM

## 2021-10-05 DIAGNOSIS — J301 Allergic rhinitis due to pollen: Secondary | ICD-10-CM

## 2021-10-05 MED ORDER — FLUTICASONE PROPIONATE 50 MCG/ACT NA SUSP: 2.0000 | Freq: Every day | NASAL | 0 refills | Status: AC

## 2021-10-05 MED ORDER — PREDNISONE 20 MG PO TABS: 40.0000 mg | ORAL_TABLET | Freq: Every day | ORAL | 0 refills | Status: AC

## 2021-10-05 NOTE — ED Provider Notes (Signed)
?[facility] ? ? ? ?CSN: [id number] ?Arrival date & time: 10/05/21  1444 ? ? ?  ? ?History   ?Chief Complaint ?Chief Complaint  ?Patient presents with  ? URI  ? ? ?HPI ?Stephen Pennington is a 26 y.o. male.  ? ?Here for uri symptoms x 2 days.   ?Sinus congestion, drainage.  Tan in color.  He has had some pain/pressure in his sinuses.  ?No otc meds used.  She has been using allergy meds without much help.  ?No fevers/chills.  ? ?Past Medical History:  ?Diagnosis Date  ? Asthma   ? Pollen allergies   ? Seasonal allergies   ? ? ?Patient Active Problem List  ? Diagnosis Date Noted  ? Viral syndrome 02/18/2013  ? ? ?History reviewed. No pertinent surgical history. ? ? ? ? ?Home Medications   ? ?Prior to Admission medications   ?Medication Sig Start Date End Date Taking? Authorizing Provider  ?ibuprofen (ADVIL) 800 MG tablet Take 1 tablet (800 mg total) by mouth 3 (three) times daily. 03/17/19   Wieters, Hallie C, PA-C  ?metroNIDAZOLE (FLAGYL) 500 MG tablet Take 1 tablet (500 mg total) by mouth 2 (two) times daily. 06/29/20   Merrilee Jansky, MD  ?mupirocin ointment (BACTROBAN) 2 % Apply 1 application topically 2 (two) times daily. 03/17/19   Wieters, Hallie C, PA-C  ?ondansetron (ZOFRAN ODT) 4 MG disintegrating tablet Take 1 tablet (4 mg total) by mouth every 8 (eight) hours as needed for nausea or vomiting. 09/12/20   Tilden Fossa, MD  ? ? ?Family History ?History reviewed. No pertinent family history. ? ?Social History ?Social History  ? ?Tobacco Use  ? Smoking status: Some Days  ? Smokeless tobacco: Never  ?Vaping Use  ? Vaping Use: Never used  ?Substance Use Topics  ? Alcohol use: No  ? Drug use: No  ? ? ? ?Allergies   ?Patient has no known allergies. ? ? ?Review of Systems ?Review of Systems  ?Constitutional: Negative.   ?HENT:  Positive for congestion and rhinorrhea. Negative for sore throat.   ?Eyes: Negative.   ?Respiratory: Negative.    ?Cardiovascular: Negative.   ?Gastrointestinal: Negative.    ?Genitourinary: Negative.   ? ? ?Physical Exam ?Triage Vital Signs ?ED Triage Vitals  ?Enc Vitals Group  ?   BP 10/05/21 1513 (!) 142/80  ?   Pulse Rate 10/05/21 1513 84  ?   Resp 10/05/21 1513 17  ?   Temp 10/05/21 1513 98.1 ?F (36.7 ?C)  ?   Temp Source 10/05/21 1513 Oral  ?   SpO2 10/05/21 1513 96 %  ?   Weight --   ?   Height --   ?   Head Circumference --   ?   Peak Flow --   ?   Pain Score 10/05/21 1514 2  ?   Pain Loc --   ?   Pain Edu? --   ?   Excl. in GC? --   ? ?No data found. ? ?Updated Vital Signs ?BP (!) 142/80 (BP Location: Right Arm)   Pulse 84   Temp 98.1 ?F (36.7 ?C) (Oral)   Resp 17   SpO2 96%  ? ?Visual Acuity ?Right Eye Distance:   ?Left Eye Distance:   ?Bilateral Distance:   ? ?Right Eye Near:   ?Left Eye Near:    ?Bilateral Near:    ? ?Physical Exam ?Constitutional:   ?   Appearance: Normal appearance.  ?HENT:  ?  Head: Normocephalic and atraumatic.  ?   Nose: Congestion present.  ?   Right Sinus: No maxillary sinus tenderness or frontal sinus tenderness.  ?   Left Sinus: No maxillary sinus tenderness or frontal sinus tenderness.  ?   Mouth/Throat:  ?   Mouth: Mucous membranes are moist.  ?Cardiovascular:  ?   Rate and Rhythm: Normal rate and regular rhythm.  ?Pulmonary:  ?   Effort: Pulmonary effort is normal.  ?   Breath sounds: Normal breath sounds.  ?Musculoskeletal:  ?   Cervical back: Normal range of motion.  ?Neurological:  ?   Mental Status: He is alert.  ? ? ? ?UC Treatments / Results  ?Labs ?(all labs ordered are listed, but only abnormal results are displayed) ?Labs Reviewed - No data to display ? ?EKG ? ? ?Radiology ?No results found. ? ?Procedures ?Procedures (including critical care time) ? ?Medications Ordered in UC ?Medications - No data to display ? ?Initial Impression / Assessment and Plan / UC Course  ?I have reviewed the triage vital signs and the nursing notes. ? ?Pertinent labs & imaging results that were available during my care of the patient were reviewed by me  and considered in my medical decision making (see chart for details). ? ?  ?Final Clinical Impressions(s) / UC Diagnoses  ? ?Final diagnoses:  ?Seasonal allergic rhinitis due to pollen  ?Sinus congestion  ? ? ? ?Discharge Instructions   ? ?  ?You were seen today for upper respiratory symptoms likely related to allergies.  ?I have sent out a nasal spray and steroid to take for several days.  ?I recommend over the counter claritin or zyrtec as well.  ?If your symptoms last up to 7-10 days, and you have sinus pain/pressure then please return for further evaluation.  ? ? ? ?ED Prescriptions   ? ? Medication Sig Dispense Auth. Provider  ? fluticasone (FLONASE) 50 MCG/ACT nasal spray Place 2 sprays into both nostrils daily. 16 g Lasaundra Riche, MD  ? predniSONE (DELTASONE) 20 MG tablet Take 2 tablets (40 mg total) by mouth daily for 3 days. 6 tablet Jannifer Franklin, MD  ? ?  ? ?PDMP not reviewed this encounter. ?  Jannifer Franklin, MD ?10/05/21 1529 ? ?

## 2021-10-05 NOTE — Discharge Instructions (Signed)
You were seen today for upper respiratory symptoms likely related to allergies.  ?I have sent out a nasal spray and steroid to take for several days.  ?I recommend over the counter claritin or zyrtec as well.  ?If your symptoms last up to 7-10 days, and you have sinus pain/pressure then please return for further evaluation.  ?

## 2021-10-05 NOTE — ED Triage Notes (Signed)
Pt presents with sinus congestion and post nasal drainage X 2 days. ?

## 2022-05-15 ENCOUNTER — Emergency Department (HOSPITAL_BASED_OUTPATIENT_CLINIC_OR_DEPARTMENT_OTHER)
Admission: EM | Admit: 2022-05-15 | Discharge: 2022-05-15 | Disposition: A | Discharge status: Home / Self Care | Payer: Self-pay | Attending: Emergency Medicine | Admitting: Emergency Medicine

## 2022-05-15 ENCOUNTER — Other Ambulatory Visit: Payer: Self-pay

## 2022-05-15 ENCOUNTER — Other Ambulatory Visit (HOSPITAL_BASED_OUTPATIENT_CLINIC_OR_DEPARTMENT_OTHER): Payer: Self-pay

## 2022-05-15 ENCOUNTER — Encounter (HOSPITAL_BASED_OUTPATIENT_CLINIC_OR_DEPARTMENT_OTHER): Payer: Self-pay

## 2022-05-15 DIAGNOSIS — Z202 Contact with and (suspected) exposure to infections with a predominantly sexual mode of transmission: Secondary | ICD-10-CM

## 2022-05-15 DIAGNOSIS — N390 Urinary tract infection, site not specified: Secondary | ICD-10-CM | POA: Insufficient documentation

## 2022-05-15 DIAGNOSIS — A64 Unspecified sexually transmitted disease: Secondary | ICD-10-CM | POA: Insufficient documentation

## 2022-05-15 DIAGNOSIS — R300 Dysuria: Secondary | ICD-10-CM

## 2022-05-15 LAB — URINALYSIS, ROUTINE W REFLEX MICROSCOPIC
Bilirubin Urine: NEGATIVE
Glucose, UA: NEGATIVE mg/dL
Hgb urine dipstick: NEGATIVE
Ketones, ur: NEGATIVE mg/dL
Nitrite: NEGATIVE
Protein, ur: NEGATIVE mg/dL
Specific Gravity, Urine: 1.021 (ref 1.005–1.030)
WBC, UA: >50 WBC/hpf — ABNORMAL HIGH (ref 0–5)
pH: 5.5 (ref 5.0–8.0)

## 2022-05-15 LAB — HIV ANTIBODY (ROUTINE TESTING W REFLEX): HIV Screen 4th Generation wRfx: NONREACTIVE

## 2022-05-15 MED ORDER — DOXYCYCLINE MONOHYDRATE 100 MG PO CAPS
100.0000 mg | ORAL_CAPSULE | Freq: Two times a day (BID) | ORAL | 0 refills | Status: AC
  Filled 2022-05-15: qty 14, 7d supply, fill #0

## 2022-05-15 MED ORDER — LIDOCAINE HCL (PF) 1 % IJ SOLN
1.0000 mL | Freq: Once | INTRAMUSCULAR | Status: AC
  Administered 2022-05-15: 1 mL
  Filled 2022-05-15: qty 5

## 2022-05-15 MED ORDER — METRONIDAZOLE 500 MG PO TABS
2000.0000 mg | ORAL_TABLET | Freq: Once | ORAL | Status: AC
  Administered 2022-05-15: 2000 mg via ORAL
  Filled 2022-05-15: qty 4

## 2022-05-15 MED ORDER — CEFTRIAXONE SODIUM 500 MG IJ SOLR
500.0000 mg | Freq: Once | INTRAMUSCULAR | Status: AC
  Administered 2022-05-15: 500 mg via INTRAMUSCULAR
  Filled 2022-05-15: qty 500

## 2022-05-15 NOTE — ED Provider Notes (Signed)
[facility] EMERGENCY DEPT Provider Note   CSN: [id number] Arrival date & time: 05/15/22  1109     History  Chief Complaint  Patient presents with   Dysuria    Stephen Pennington is a 26 y.o. male with Hx of prior STD exposures presenting to the ED today due to dysuria.  States he believes he was possibly exposed to an STD within the last month.  States he is unsure of his partner's sexual behavior, and became concerned when he developed symptoms 2 to 3 days ago.  Denies painful or difficulty with bowel movements.  Denies blood in the urine, urinary retention, or urgency.  Denies fever, chills, nausea, vomiting, or diarrhea.  Denies abdominal pain or back pain.  Denies penile pain, though notes some scant intermittent discharge.  Denies testicular pain or swelling.  The history is provided by the patient and medical records.  Dysuria Presenting symptoms: dysuria       Home Medications Prior to Admission medications   Medication Sig Start Date End Date Taking? Authorizing Provider  doxycycline (MONODOX) 100 MG capsule Take 1 capsule (100 mg total) by mouth 2 (two) times daily for 7 days. 05/15/22 05/22/22 Yes Cecil Cobbs, PA-C  fluticasone (FLONASE) 50 MCG/ACT nasal spray Place 2 sprays into both nostrils daily. 10/05/21   Piontek, Denny Peon, MD  ibuprofen (ADVIL) 800 MG tablet Take 1 tablet (800 mg total) by mouth 3 (three) times daily. 03/17/19   Wieters, Hallie C, PA-C      Allergies    Patient has no known allergies.    Review of Systems   Review of Systems  Genitourinary:  Positive for dysuria.    Physical Exam Updated Vital Signs BP (!) 139/114 (BP Location: Right Arm)   Pulse 84   Temp 97.9 F (36.6 C) (Oral)   Resp 18   Ht 5\' 7"  (1.702 m)   Wt 99.8 kg   SpO2 99%   BMI 34.46 kg/m  Physical Exam Vitals and nursing note reviewed. Exam conducted with a chaperone present.  Constitutional:      General: He is not in acute distress.     Appearance: He is well-developed. He is not ill-appearing, toxic-appearing or diaphoretic.  HENT:     Head: Normocephalic and atraumatic.  Eyes:     General:        Right eye: No discharge.        Left eye: No discharge.     Conjunctiva/sclera: Conjunctivae normal.  Neck:     Comments: No meningismus or torticollis Cardiovascular:     Rate and Rhythm: Normal rate and regular rhythm.     Heart sounds: No murmur heard. Pulmonary:     Effort: Pulmonary effort is normal. No respiratory distress.     Breath sounds: Normal breath sounds.  Abdominal:     General: There is no distension.     Palpations: Abdomen is soft. There is no mass.     Tenderness: There is no abdominal tenderness. There is no right CVA tenderness, left CVA tenderness or guarding.  Genitourinary:    Pubic Area: No rash.      Penis: Normal and circumcised.      Testes: Normal.     Epididymis:     Right: Normal.     Left: Normal.     Comments: Chaperone present for this portion of the exam.  No obvious lesions or penile discharge appreciated.  Without penile, scrotal, or testicular tenderness, erythema, or swelling. Musculoskeletal:  General: No swelling.     Cervical back: Neck supple. No rigidity.  Lymphadenopathy:     Cervical: No cervical adenopathy.     Lower Body: No right inguinal adenopathy. No left inguinal adenopathy.  Skin:    General: Skin is warm and dry.     Capillary Refill: Capillary refill takes less than 2 seconds.     Coloration: Skin is not jaundiced or pale.  Neurological:     Mental Status: He is alert.  Psychiatric:        Mood and Affect: Mood normal.     ED Results / Procedures / Treatments   Labs (all labs ordered are listed, but only abnormal results are displayed) Labs Reviewed  URINALYSIS, ROUTINE W REFLEX MICROSCOPIC - Abnormal; Notable for the following components:      Result Value   Leukocytes,Ua LARGE (*)    WBC, UA >50 (*)    All other components within normal  limits  URINE CULTURE  RPR  HIV ANTIBODY (ROUTINE TESTING W REFLEX)  GC/CHLAMYDIA PROBE AMP ([place]) NOT AT [facility]    EKG None  Radiology No results found.  Procedures Procedures    Medications Ordered in ED Medications  cefTRIAXone (ROCEPHIN) injection 500 mg (500 mg Intramuscular Given 05/15/22 1241)  lidocaine (PF) (XYLOCAINE) 1 % injection 1-2.1 mL (1 mL Other Given 05/15/22 1243)  metroNIDAZOLE (FLAGYL) tablet 2,000 mg (2,000 mg Oral Given 05/15/22 1240)    ED Course/ Medical Decision Making/ A&P                           Medical Decision Making Amount and/or Complexity of Data Reviewed Labs: ordered.   26 y.o. male presents to the ED for concern of Dysuria   This involves an extensive number of treatment options, and is a complaint that carries with it a high risk of complications and morbidity.  The emergent differential diagnosis prior to evaluation includes, but is not limited to: UTI, orchitis, epididymitis, prostatitis  This is not an exhaustive differential.   Past Medical History / Co-morbidities / Social History: Hx of prior STD exposures. Social Determinants of Health include: No PCP, resources provided  Additional History:  Obtained by chart review.  Notably prior ED visits, see for details.  Lab Tests: I ordered, and personally interpreted labs.  The pertinent results include:   UA with large leukocytes and WBC greater than 50.  Nitrite negative.  No hematuria G/C, HIV, and RPR pending Urine culture pending  Imaging Studies: None  ED Course: Pt overall well-appearing on exam.  Resting comfortably.  Presenting to ED today due to 2 days of dysuria and scant penile discharge.  Endorses possible STD exposure from partner. Nontoxic nonseptic appearing in NAD.  Patient is afebrile without abdominal tenderness, abdominal pain, flank pain, back pain, or painful bowel movements to indicate prostatitis or renal calculi.  UA suggestive of UTI, without  hematuria.  No tenderness to palpation or swelling of the testes or epididymis to suggest orchitis or epididymitis.  STD cultures obtained including HIV, syphilis, gonorrhea and chlamydia.  Pt denies recent alcohol consumption in the past 72 hours. Patient to be discharged with instructions to follow up with PCP/Urology.  Urine culture pending.  Discussed importance of using protection when sexually active. Pt understands that they have GC/Chlamydia cultures pending and that they will need to inform all sexual partners if results return positive.  Patient has been treated prophylactically with Flagyl and  Rocephin.   Doxycycline has been sent to pharmacy.  Pt reports satisfaction with today's encounter.  Patient in NAD and in good condition at time of discharge.  Disposition: After consideration the patient's encounter today, I do not feel today's workup suggests an emergent condition requiring admission or immediate intervention beyond what has been performed at this time.  Safe for discharge; instructed to return immediately for worsening symptoms, change in symptoms or any other concerns.  I have reviewed the patients home medicines and have made adjustments as needed.  Discussed course of treatment with the patient, whom demonstrated understanding.  Patient in agreement and has no further questions.    This chart was dictated using voice recognition software.  Despite best efforts to proofread, errors can occur which can change the documentation meaning.         Final Clinical Impression(s) / ED Diagnoses Final diagnoses:  Urinary tract infection without hematuria, site unspecified  Dysuria  Possible exposure to STD    Rx / DC Orders ED Discharge Orders          Ordered    doxycycline (MONODOX) 100 MG capsule  2 times daily        05/15/22 1204              Cecil Cobbs, [place] 05/15/22 1327    Melene Plan, DO 05/15/22 1347

## 2022-05-15 NOTE — ED Notes (Signed)
Discharge instructions, follow up care, and prescription reviewed and explained, pt verbalized understanding. Pt caox4 and ambulatory on d/c.  

## 2022-05-15 NOTE — ED Triage Notes (Signed)
Patient here POV from Home.  Endorses Dysuria that began 2 Days ago and has worsened since. No Fevers or Discharge.   NAD Noted during Triage. A&Ox4. GCS 15. Ambulatory.

## 2022-05-15 NOTE — Discharge Instructions (Addendum)
You were seen in the emergency department today for burning with urination and penile discharge.  Your urine indicated a likely UTI.  You were treated with Rocephin and Flagyl for partial STD coverage, the remaining generalized STD coverage is to be covered by doxycycline which was sent to your pharmacy.  Please take as indicated, and until the entire course has been completed.  You have been provided the contact information for 2 local primary care offices.  Please call to schedule an appointment to establish care soon as possible.  You have also been provided the contact information for a local urologist, please follow-up with them in 1 week for reevaluation and continued medical management.  Keep in mind, your Gonorrhea and Chlamydia tests are pending.  You should receive a phone call in about 2-4 days times if results are positive with further instructions.    Return to the ED for new or worsening symptoms as discussed.

## 2022-05-16 LAB — GC/CHLAMYDIA PROBE AMP (~~LOC~~) NOT AT ARMC
Chlamydia: NEGATIVE
Comment: NEGATIVE
Comment: NORMAL
Neisseria Gonorrhea: POSITIVE — AB

## 2022-05-16 LAB — URINE CULTURE: Culture: NO GROWTH

## 2022-05-16 LAB — RPR: RPR Ser Ql: NONREACTIVE

## 2023-01-23 ENCOUNTER — Other Ambulatory Visit: Payer: Self-pay

## 2023-01-23 ENCOUNTER — Emergency Department (HOSPITAL_BASED_OUTPATIENT_CLINIC_OR_DEPARTMENT_OTHER)
Admission: EM | Admit: 2023-01-23 | Discharge: 2023-01-23 | Disposition: A | Discharge status: Home / Self Care | Payer: Medicaid Other | Attending: Emergency Medicine | Admitting: Emergency Medicine

## 2023-01-23 ENCOUNTER — Encounter (HOSPITAL_BASED_OUTPATIENT_CLINIC_OR_DEPARTMENT_OTHER): Payer: Self-pay

## 2023-01-23 DIAGNOSIS — J029 Acute pharyngitis, unspecified: Secondary | ICD-10-CM | POA: Diagnosis present

## 2023-01-23 DIAGNOSIS — J45909 Unspecified asthma, uncomplicated: Secondary | ICD-10-CM | POA: Insufficient documentation

## 2023-01-23 DIAGNOSIS — U071 COVID-19: Secondary | ICD-10-CM | POA: Insufficient documentation

## 2023-01-23 LAB — RESP PANEL BY RT-PCR (RSV, FLU A&B, COVID)  RVPGX2
Influenza A by PCR: NEGATIVE
Influenza B by PCR: NEGATIVE
Resp Syncytial Virus by PCR: NEGATIVE
SARS Coronavirus 2 by RT PCR: POSITIVE — AB

## 2023-01-23 LAB — GROUP A STREP BY PCR: Group A Strep by PCR: NOT DETECTED

## 2023-01-23 MED ORDER — ACETAMINOPHEN 325 MG PO TABS
650.0000 mg | ORAL_TABLET | Freq: Once | ORAL | Status: AC | PRN
  Administered 2023-01-23: 650 mg via ORAL
  Filled 2023-01-23: qty 2

## 2023-01-23 NOTE — Discharge Instructions (Signed)
It was a pleasure taking part in your care today.  As we discussed, you have COVID-19.  Please continue taking conservative treatment at home to include Tylenol, ibuprofen, Mucinex.  Please purchase Zicam to help shorten duration of your symptoms.  You may also take Zofran which I prescribed you every 6 hours as needed for nausea and vomiting.  Please return to the ED with any new symptoms such as shortness of breath, lightheadedness, dizziness, weakness.  Please read attached guides concerning quarantine and isolation, COVID-19.  Please see attached work note.

## 2023-01-23 NOTE — ED Provider Notes (Signed)
[place] EMERGENCY DEPARTMENT AT [facility] Provider Note   CSN: [id number] Arrival date & time: 01/23/23  1629     History  Chief Complaint  Patient presents with   Back Pain    Stephen Pennington is a 27 y.o. male with medical history of asthma, seasonal allergies.  Patient presents to ED for evaluation of URI symptoms, nausea, vomiting, headache, sore throat, body aches and chills, weakness.  Patient reports symptoms began 3 days ago.  He states that he has been around his mother who has COVID-19.  He states he has been taking Mucinex, Tylenol, ibuprofen for symptom relief.  He is also noticing fevers.  Denies chest pain, shortness of breath, lightheadedness, dizziness.   Back Pain Associated symptoms: weakness   Associated symptoms: no chest pain        Home Medications Prior to Admission medications   Medication Sig Start Date End Date Taking? Authorizing Provider  fluticasone (FLONASE) 50 MCG/ACT nasal spray Place 2 sprays into both nostrils daily. 10/05/21   Piontek, Denny Peon, MD  ibuprofen (ADVIL) 800 MG tablet Take 1 tablet (800 mg total) by mouth 3 (three) times daily. 03/17/19   Wieters, Hallie C, PA-C      Allergies    Patient has no known allergies.    Review of Systems   Review of Systems  Constitutional:  Positive for fatigue.  HENT:  Positive for congestion and sore throat.   Respiratory:  Negative for shortness of breath.   Cardiovascular:  Negative for chest pain.  Gastrointestinal:  Positive for nausea and vomiting.  Musculoskeletal:  Positive for back pain.  Neurological:  Positive for weakness. Negative for dizziness and light-headedness.  All other systems reviewed and are negative.   Physical Exam Updated Vital Signs BP (!) 140/65 (BP Location: Right Arm)   Pulse 92   Temp 99.8 F (37.7 C) (Oral)   Resp 19   Ht 5\' 7"  (1.702 m)   Wt 117.9 kg   SpO2 97%   BMI 40.72 kg/m  Physical Exam Vitals and nursing note reviewed.   Constitutional:      General: He is not in acute distress.    Appearance: Normal appearance. He is not ill-appearing, toxic-appearing or diaphoretic.  HENT:     Head: Normocephalic and atraumatic.     Nose: Nose normal.     Mouth/Throat:     Mouth: Mucous membranes are moist.     Pharynx: Oropharynx is clear.     Comments: Uvula midline, handling secretions appropriately, no drooling Eyes:     Extraocular Movements: Extraocular movements intact.     Conjunctiva/sclera: Conjunctivae normal.     Pupils: Pupils are equal, round, and reactive to light.  Cardiovascular:     Rate and Rhythm: Normal rate and regular rhythm.  Pulmonary:     Effort: Pulmonary effort is normal.     Breath sounds: Normal breath sounds. No wheezing.  Abdominal:     General: Abdomen is flat. Bowel sounds are normal.     Palpations: Abdomen is soft.     Tenderness: There is no abdominal tenderness.  Musculoskeletal:     Cervical back: Normal range of motion and neck supple. No tenderness.  Skin:    General: Skin is warm and dry.     Capillary Refill: Capillary refill takes less than 2 seconds.  Neurological:     General: No focal deficit present.     Mental Status: He is alert and oriented to person, place,  and time.     GCS: GCS eye subscore is 4. GCS verbal subscore is 5. GCS motor subscore is 6.     Cranial Nerves: Cranial nerves 2-12 are intact. No cranial nerve deficit.     Sensory: Sensation is intact. No sensory deficit.     Motor: Motor function is intact. No weakness.     Coordination: Coordination is intact. Heel to [facility] Test normal.     ED Results / Procedures / Treatments   Labs (all labs ordered are listed, but only abnormal results are displayed) Labs Reviewed  RESP PANEL BY RT-PCR (RSV, FLU A&B, COVID)  RVPGX2 - Abnormal; Notable for the following components:      Result Value   SARS Coronavirus 2 by RT PCR POSITIVE (*)    All other components within normal limits  GROUP A STREP BY  PCR    EKG None  Radiology No results found.  Procedures Procedures   Medications Ordered in ED Medications  acetaminophen (TYLENOL) tablet 650 mg (650 mg Oral Given 01/23/23 1708)    ED Course/ Medical Decision Making/ A&P  Medical Decision Making Risk OTC drugs.   27 year old no presents to ED for evaluation.  Please see HPI for further details.  On examination patient is afebrile, nontachycardic.  His lung sounds are clear bilaterally and he is not hypoxic.  His abdomen is soft and compressible throughout.  Neurological examination at baseline.  Posterior oropharynx has erythema without exudate.  Uvula midline, handling secretions appropriately, no drooling, no change in phonation.  Upon patient arrival he was tachycardic to 101 bpm.  He was febrile as well.  Tylenol was administered and his pulse rate is normalized, he is currently sitting with a temperature of 99.8 Fahrenheit orally.  COVID-19 testing positive here.  Most likely cause of patient symptoms.  Patient was sent home with Zofran for nausea and vomiting.  Patient advised to continue treating symptoms at home conservatively.  Encouraged to purchase Zicam.  He was given return precautions and he voiced understanding.  Had all of his questions answered to his satisfaction.  Stable to discharge at this time.   Final Clinical Impression(s) / ED Diagnoses Final diagnoses:  COVID-19    Rx / DC Orders ED Discharge Orders     None         Al Decant, PA-C 01/23/23 1815    Glyn Ade, MD 01/23/23 2259

## 2023-01-23 NOTE — ED Triage Notes (Signed)
Patient here POV from Home.  Endorses Nausea, Back Pain, Fever, Chills, Cough, Sore Throat, Congestion, Body Aches for about 2-3 Days.  Recent contact with Individual with COVID-19.   NAD Noted during Triage. A&Ox4. Gcs 15. BIB Wheelchair.

## 2024-01-21 ENCOUNTER — Other Ambulatory Visit: Payer: Self-pay

## 2024-01-21 ENCOUNTER — Encounter (HOSPITAL_BASED_OUTPATIENT_CLINIC_OR_DEPARTMENT_OTHER): Payer: Self-pay

## 2024-01-21 ENCOUNTER — Emergency Department (HOSPITAL_BASED_OUTPATIENT_CLINIC_OR_DEPARTMENT_OTHER)
Admission: EM | Admit: 2024-01-21 | Discharge: 2024-01-21 | Disposition: A | Discharge status: Home / Self Care | Attending: Emergency Medicine | Admitting: Emergency Medicine

## 2024-01-21 DIAGNOSIS — R300 Dysuria: Secondary | ICD-10-CM | POA: Diagnosis present

## 2024-01-21 DIAGNOSIS — Z113 Encounter for screening for infections with a predominantly sexual mode of transmission: Secondary | ICD-10-CM | POA: Insufficient documentation

## 2024-01-21 LAB — URINALYSIS, ROUTINE W REFLEX MICROSCOPIC
Bilirubin Urine: NEGATIVE
Glucose, UA: NEGATIVE mg/dL
Hgb urine dipstick: NEGATIVE
Ketones, ur: NEGATIVE mg/dL
Leukocytes,Ua: NEGATIVE
Nitrite: NEGATIVE
Protein, ur: NEGATIVE mg/dL
Specific Gravity, Urine: 1.029 (ref 1.005–1.030)
pH: 6.5 (ref 5.0–8.0)

## 2024-01-21 LAB — HIV ANTIBODY (ROUTINE TESTING W REFLEX): HIV Screen 4th Generation wRfx: NONREACTIVE

## 2024-01-21 MED ORDER — CEFTRIAXONE SODIUM 1 G IJ SOLR
1.0000 g | Freq: Once | INTRAMUSCULAR | Status: AC
  Administered 2024-01-21: 1 g via INTRAMUSCULAR
  Filled 2024-01-21: qty 10

## 2024-01-21 MED ORDER — AZITHROMYCIN 250 MG PO TABS
1000.0000 mg | ORAL_TABLET | Freq: Once | ORAL | Status: AC
  Administered 2024-01-21: 1000 mg via ORAL
  Filled 2024-01-21: qty 4

## 2024-01-21 NOTE — ED Notes (Signed)
 Pt given discharge instructions. Opportunities given for questions. Pt stable at time of discharge.

## 2024-01-21 NOTE — ED Provider Notes (Signed)
 [place] EMERGENCY DEPARTMENT AT [facility] Provider Note   CSN: [id number] Arrival date & time: 01/21/24  1820     Patient presents with: Urinary Tract Infection   Stephen Pennington is a 28 y.o. male patient with noncontributory past medical history reports to emergency room with complaint of urinary symptoms.  Primarily reports he has discomfort toward the end of urinating which he is having a hard time describing.  He notes it is uncomfortable but no burning or pain.  He is sexually active with multiple partners and does not always have protected sex.  Denies any penile discharge.  He denies suprapubic pain, rash or difficulty initiating stream.  He has no abdominal pain or fever.    Urinary Tract Infection Presenting symptoms: dysuria        Prior to Admission medications   Medication Sig Start Date End Date Taking? Authorizing Provider  fluticasone  (FLONASE ) 50 MCG/ACT nasal spray Place 2 sprays into both nostrils daily. 10/05/21   Piontek, Rocky, MD  ibuprofen  (ADVIL ) 800 MG tablet Take 1 tablet (800 mg total) by mouth 3 (three) times daily. 03/17/19   Wieters, Hallie C, PA-C    Allergies: Patient has no known allergies.    Review of Systems  Genitourinary:  Positive for dysuria.    Updated Vital Signs BP (!) 145/71 (BP Location: Right Arm)   Pulse 92   Temp 98.6 F (37 C) (Oral)   Resp 14   SpO2 97%   Physical Exam Vitals and nursing note reviewed.  Constitutional:      General: He is not in acute distress.    Appearance: He is not toxic-appearing.  HENT:     Head: Normocephalic and atraumatic.  Eyes:     General: No scleral icterus.    Conjunctiva/sclera: Conjunctivae normal.  Cardiovascular:     Rate and Rhythm: Normal rate and regular rhythm.     Pulses: Normal pulses.     Heart sounds: Normal heart sounds.  Pulmonary:     Effort: Pulmonary effort is normal. No respiratory distress.     Breath sounds: Normal breath sounds.  Abdominal:      General: Abdomen is flat. Bowel sounds are normal.     Palpations: Abdomen is soft.     Tenderness: There is no abdominal tenderness.  Musculoskeletal:     Right lower leg: No edema.     Left lower leg: No edema.  Skin:    General: Skin is warm and dry.     Findings: No lesion.  Neurological:     General: No focal deficit present.     Mental Status: He is alert and oriented to person, place, and time. Mental status is at baseline.     (all labs ordered are listed, but only abnormal results are displayed) Labs Reviewed  URINALYSIS, ROUTINE W REFLEX MICROSCOPIC  HIV ANTIBODY (ROUTINE TESTING W REFLEX)  RPR  GC/CHLAMYDIA PROBE AMP ([place]) NOT AT [facility]    EKG: None  Radiology: No results found.   Procedures   Medications Ordered in the ED  cefTRIAXone  (ROCEPHIN ) injection 1 g (has no administration in time range)  azithromycin  (ZITHROMAX ) tablet 1,000 mg (has no administration in time range)                                    Medical Decision Making Amount and/or Complexity of Data Reviewed Labs: ordered.  Risk Prescription  drug management.   This patient presents to the ED for concern of STD testing, this involves an extensive number of treatment options, and is a complaint that carries with it a high risk of complications and morbidity.  The differential diagnosis includes urethritis, prostatitis, nephritis, kidney stone, acute cystitis, STD.   Lab Tests:  I personally interpreted labs.  The pertinent results include:   Urine is negative for nitrites and leukocytes.  Gonorrhea chlamydia, RPR and HIV pending.   Problem List / ED Course / Critical interventions / Medication management  Patient presents with concern STD versus UTI.  On my exam abdomen is soft, nondistended and nontender tender.  He declines GU exam.  He is primarily noting dysuria.  His UA here is negative for nitrates or leukocytes.  Denies other symptoms.  He would like to be  empirically treated for STDs.  Gonorrhea, chlamydia RPR and HIV are pending.  He will monitor results on MyChart. I ordered medication including Rocephin , Azithromycin .  Reevaluation of the patient after these medicines showed that the patient stayed the same I have reviewed the patients home medicines and have made adjustments as needed. Patient is stable for discharge with outpatient follow-up.  He was given return precautions.       Final diagnoses:  Dysuria  Screen for STD (sexually transmitted disease)    ED Discharge Orders     None          Shermon, Warren LOISE RIGGERS 01/21/24 2005    Jerrol Agent, MD 01/21/24 2052

## 2024-01-21 NOTE — ED Triage Notes (Signed)
 Patient reports 2-3 days of urinary symptoms. Reports drinking lots of soda. Patient also reports unprotected sex recently. Denies drainage.

## 2024-01-21 NOTE — ED Notes (Signed)
Lab called about add on. 

## 2024-01-21 NOTE — Discharge Instructions (Addendum)
 Please monitor your results on MyChart.  Return to emergency room with new or worsening symptoms.  Follow-up with primary care.

## 2024-01-22 LAB — RPR: RPR Ser Ql: NONREACTIVE

## 2024-01-22 LAB — GC/CHLAMYDIA PROBE AMP (~~LOC~~) NOT AT ARMC
Chlamydia: NEGATIVE
Comment: NEGATIVE
Comment: NORMAL
Neisseria Gonorrhea: NEGATIVE

## 2024-03-16 ENCOUNTER — Telehealth: Admitting: Family Medicine

## 2024-03-16 DIAGNOSIS — J069 Acute upper respiratory infection, unspecified: Secondary | ICD-10-CM | POA: Diagnosis not present

## 2024-03-16 MED ORDER — PSEUDOEPH-BROMPHEN-DM 30-2-10 MG/5ML PO SYRP: 5.0000 mL | ORAL_SOLUTION | Freq: Four times a day (QID) | ORAL | 0 refills | Status: AC | PRN

## 2024-03-16 MED ORDER — IPRATROPIUM BROMIDE 0.03 % NA SOLN: 2.0000 | Freq: Two times a day (BID) | NASAL | 0 refills | Status: AC

## 2024-03-16 MED ORDER — BENZONATATE 100 MG PO CAPS: 200.0000 mg | ORAL_CAPSULE | Freq: Three times a day (TID) | ORAL | 0 refills | Status: AC | PRN

## 2024-03-16 NOTE — Progress Notes (Signed)
 Virtual Visit Consent   Stephen Pennington, you are scheduled for a virtual visit with a [facility] provider today. Just as with appointments in the office, your consent must be obtained to participate. Your consent will be active for this visit and any virtual visit you may have with one of our providers in the next 365 days. If you have a MyChart account, a copy of this consent can be sent to you electronically.  As this is a virtual visit, video technology does not allow for your provider to perform a traditional examination. This may limit your provider's ability to fully assess your condition. If your provider identifies any concerns that need to be evaluated in person or the need to arrange testing (such as labs, EKG, etc.), we will make arrangements to do so. Although advances in technology are sophisticated, we cannot ensure that it will always work on either your end or our end. If the connection with a video visit is poor, the visit may have to be switched to a telephone visit. With either a video or telephone visit, we are not always able to ensure that we have a secure connection.  By engaging in this virtual visit, you consent to the provision of healthcare and authorize for your insurance to be billed (if applicable) for the services provided during this visit. Depending on your insurance coverage, you may receive a charge related to this service.  I need to obtain your verbal consent now. Are you willing to proceed with your visit today? Stephen Pennington has provided verbal consent on 03/16/2024 for a virtual visit (video or telephone). [name], NP  Date: 03/16/2024 8:20 AM   Virtual Visit via Video Note   I, [name], connected with  Stephen Pennington  ([id number], 1995/07/22) on 03/16/24 at  8:15 AM EDT by a video-enabled telemedicine application and verified that I am speaking with the correct person using two identifiers.  Location: Patient: Virtual Visit  Location Patient: Home Provider: Virtual Visit Location Provider: Home Office   I discussed the limitations of evaluation and management by telemedicine and the availability of in person appointments. The patient expressed understanding and agreed to proceed.    History of Present Illness: Stephen Pennington is a 28 y.o. who identifies as a male who was assigned male at birth, and is being seen today for cold symptoms  Onset was 4 days ago- started with chills off and on, sore throat, cough- that makes throat hurt more, lots of mucus, and fatigue  Associated symptoms are body aches, headaches, and ear pain Modifying factors are ibuprofen  and Tylenol , and mucinex  Denies chest pain, shortness of breath, fevers (unknown)  Exposure to sick contacts- known with sick  COVID test: none Vaccines: none   Problems:  Patient Active Problem List   Diagnosis Date Noted   Viral syndrome 02/18/2013    Allergies: No Known Allergies Medications:  Current Outpatient Medications:    fluticasone  (FLONASE ) 50 MCG/ACT nasal spray, Place 2 sprays into both nostrils daily., Disp: 16 g, Rfl: 0   ibuprofen  (ADVIL ) 800 MG tablet, Take 1 tablet (800 mg total) by mouth 3 (three) times daily., Disp: 21 tablet, Rfl: 0  Observations/Objective: Patient is well-developed, well-nourished in no acute distress.  Resting comfortably  at home.  Head is normocephalic, atraumatic.  No labored breathing.  Speech is clear and coherent with logical content.  Patient is alert and oriented at baseline.    Assessment and Plan:  1. Viral URI with cough (Primary)  - ipratropium (ATROVENT) 0.03 % nasal spray; Place 2 sprays into both nostrils every 12 (twelve) hours.  Dispense: 30 mL; Refill: 0 - benzonatate (TESSALON) 100 MG capsule; Take 2 capsules (200 mg total) by mouth 3 (three) times daily as needed for cough.  Dispense: 30 capsule; Refill: 0 - brompheniramine-pseudoephedrine-DM 30-2-10 MG/5ML syrup; Take 5 mLs  by mouth 4 (four) times daily as needed.  Dispense: 120 mL; Refill: 0  URI recommendations: -Covid test encouraged - Increased rest - Increasing Fluids - Acetaminophen  / ibuprofen  as needed for fever/pain.  - Salt water gargling, chloraseptic spray and throat lozenges - Mucinex if mucus is present and increasing.  - Saline nasal spray if congestion or if nasal passages feel dry. - Humidifying the air.  -Follow up if not improving  Reviewed side effects, risks and benefits of medication.    Patient acknowledged agreement and understanding of the plan.   Past Medical, Surgical, Social History, Allergies, and Medications have been Reviewed.     Follow Up Instructions: I discussed the assessment and treatment plan with the patient. The patient was provided an opportunity to ask questions and all were answered. The patient agreed with the plan and demonstrated an understanding of the instructions.  A copy of instructions were sent to the patient via MyChart unless otherwise noted below.    The patient was advised to call back or seek an in-person evaluation if the symptoms worsen or if the condition fails to improve as anticipated.    [name], NP

## 2024-03-16 NOTE — Patient Instructions (Addendum)
 Stephen Pennington, thank you for joining Stephen CHRISTELLA Barefoot, NP for today's virtual visit.  While this provider is not your primary care provider (PCP), if your PCP is located in our provider database this encounter information will be shared with them immediately following your visit.   A [facility] MyChart account gives you access to today's visit and all your visits, tests, and labs performed at [facility]  click here if you don't have a [name] account or go to [email or web address]  Consent: (Patient) Stephen Pennington provided verbal consent for this virtual visit at the beginning of the encounter.  Current Medications:  Current Outpatient Medications:    benzonatate (TESSALON) 100 MG capsule, Take 2 capsules (200 mg total) by mouth 3 (three) times daily as needed for cough., Disp: 30 capsule, Rfl: 0   brompheniramine-pseudoephedrine-DM 30-2-10 MG/5ML syrup, Take 5 mLs by mouth 4 (four) times daily as needed., Disp: 120 mL, Rfl: 0   ipratropium (ATROVENT) 0.03 % nasal spray, Place 2 sprays into both nostrils every 12 (twelve) hours., Disp: 30 mL, Rfl: 0   fluticasone  (FLONASE ) 50 MCG/ACT nasal spray, Place 2 sprays into both nostrils daily., Disp: 16 g, Rfl: 0   ibuprofen  (ADVIL ) 800 MG tablet, Take 1 tablet (800 mg total) by mouth 3 (three) times daily., Disp: 21 tablet, Rfl: 0   Medications ordered in this encounter:  Meds ordered this encounter  Medications   ipratropium (ATROVENT) 0.03 % nasal spray    Sig: Place 2 sprays into both nostrils every 12 (twelve) hours.    Dispense:  30 mL    Refill:  0    Supervising Provider:   BLAISE ALEENE Pennington [[id number]]   benzonatate (TESSALON) 100 MG capsule    Sig: Take 2 capsules (200 mg total) by mouth 3 (three) times daily as needed for cough.    Dispense:  30 capsule    Refill:  0    Supervising Provider:   [name], PHILIP Pennington [[id number]]   brompheniramine-pseudoephedrine-DM 30-2-10 MG/5ML syrup    Sig:  Take 5 mLs by mouth 4 (four) times daily as needed.    Dispense:  120 mL    Refill:  0    Supervising Provider:   BLAISE ALEENE Pennington [[id number]]     *If you need refills on other medications prior to your next appointment, please contact your pharmacy*  Follow-Up: Call back or seek an in-person evaluation if the symptoms worsen or if the condition fails to improve as anticipated.  [facility] [phone]  Other Instructions  -Covid test encouraged - Increased rest - Increasing Fluids - Acetaminophen  / ibuprofen  as needed for fever/pain.  - Salt water gargling, chloraseptic spray and throat lozenges - Mucinex if mucus is present and increasing.  - Saline nasal spray if congestion or if nasal passages feel dry. - Humidifying the air.   -Follow up if not improving  If you have been instructed to have an in-person evaluation today at a local Urgent Care facility, please use the link below. It will take you to a list of all of our available [facility], including address, phone number and hours of operation. Please do not delay care.  [facility]  If you or a family member do not have a primary care provider, use the link below to schedule a visit and establish care. When you choose a [facility] primary care physician or advanced practice provider, you gain a long-term partner  in health. Find a Primary Care Provider  Learn more about [facility]'s in-office and virtual care options: [facility] Get Care Now

## 2024-06-25 ENCOUNTER — Other Ambulatory Visit: Payer: Self-pay

## 2024-06-25 ENCOUNTER — Emergency Department (HOSPITAL_BASED_OUTPATIENT_CLINIC_OR_DEPARTMENT_OTHER)
Admission: EM | Admit: 2024-06-25 | Discharge: 2024-06-25 | Disposition: A | Discharge status: Home / Self Care | Attending: Emergency Medicine | Admitting: Emergency Medicine

## 2024-06-25 ENCOUNTER — Encounter (HOSPITAL_BASED_OUTPATIENT_CLINIC_OR_DEPARTMENT_OTHER): Payer: Self-pay | Admitting: Emergency Medicine

## 2024-06-25 ENCOUNTER — Other Ambulatory Visit (HOSPITAL_BASED_OUTPATIENT_CLINIC_OR_DEPARTMENT_OTHER): Payer: Self-pay

## 2024-06-25 DIAGNOSIS — K047 Periapical abscess without sinus: Secondary | ICD-10-CM | POA: Insufficient documentation

## 2024-06-25 DIAGNOSIS — J45909 Unspecified asthma, uncomplicated: Secondary | ICD-10-CM | POA: Diagnosis not present

## 2024-06-25 DIAGNOSIS — L0291 Cutaneous abscess, unspecified: Secondary | ICD-10-CM

## 2024-06-25 MED ORDER — ACETAMINOPHEN 500 MG PO TABS
1000.0000 mg | ORAL_TABLET | Freq: Once | ORAL | Status: AC
  Administered 2024-06-25: 1000 mg via ORAL
  Filled 2024-06-25: qty 2

## 2024-06-25 MED ORDER — LIDOCAINE HCL (PF) 1 % IJ SOLN
5.0000 mL | INTRAMUSCULAR | Status: DC
  Administered 2024-06-25: 5 mL

## 2024-06-25 MED ORDER — AMOXICILLIN-POT CLAVULANATE 875-125 MG PO TABS
1.0000 | ORAL_TABLET | Freq: Two times a day (BID) | ORAL | 0 refills | Status: AC
  Filled 2024-06-25: qty 14, 7d supply, fill #0

## 2024-06-25 MED ORDER — LIDOCAINE HCL (PF) 1 % IJ SOLN
5.0000 mL | Freq: Once | INTRAMUSCULAR | Status: AC
  Administered 2024-06-25: 5 mL
  Filled 2024-06-25: qty 5

## 2024-06-25 NOTE — ED Notes (Signed)
 DC paperwork given and verbally understood.

## 2024-06-25 NOTE — Discharge Instructions (Signed)
 You were found to have a draining dental abscess and an abdominal abscess.  This was drained.  Please change the dressing daily and clean it with soap and water.  If you experience any new or worsening symptoms please return to the emergency room.  Otherwise please follow-up with a dentist.  Please complete the full course of antibiotics.

## 2024-06-25 NOTE — ED Triage Notes (Signed)
 Pt caox4 ambulatory stating he noticed a knot on his R lower stomach x2 days and thinks it may be an abscess. Pt also reports he had a tooth crack on the R upper side 4 days ago which has been causing him significant pain since. Last had tylenol  for pain last night.

## 2024-06-25 NOTE — ED Provider Notes (Signed)
 " [place] EMERGENCY DEPARTMENT AT [facility] Provider Note   CSN: [id number] Arrival date & time: 06/25/24  9053     Patient presents with: Abscess   Stephen Pennington is a 29 y.o. male with noncontributory past well history presents for concern for possible abscess on his abdomen.  States he noticed it several days ago.  Tried to drain it last night.  Notes that he had copious clear and bloody drainage.  Does describe localized pain without any other abdominal pain vomiting or diarrhea.  Additionally complains of dental pain for the past few days as well.  No injury or trauma.  No difficulty swallowing or breathing.  No fevers or chills.    Abscess Associated symptoms: fever       Past Medical History:  Diagnosis Date   Asthma    Pollen allergies    Seasonal allergies    History reviewed. No pertinent surgical history.   Prior to Admission medications  Medication Sig Start Date End Date Taking? Authorizing Provider  amoxicillin -clavulanate (AUGMENTIN ) 875-125 MG tablet Take 1 tablet by mouth every 12 (twelve) hours. 06/25/24  Yes Donnajean Lynwood DEL, PA-C  benzonatate  (TESSALON ) 100 MG capsule Take 2 capsules (200 mg total) by mouth 3 (three) times daily as needed for cough. 03/16/24   Moishe Chiquita HERO, NP  brompheniramine-pseudoephedrine-DM 30-2-10 MG/5ML syrup Take 5 mLs by mouth 4 (four) times daily as needed. 03/16/24   Moishe Chiquita HERO, NP  fluticasone  (FLONASE ) 50 MCG/ACT nasal spray Place 2 sprays into both nostrils daily. 10/05/21   Piontek, Rocky, MD  ibuprofen  (ADVIL ) 800 MG tablet Take 1 tablet (800 mg total) by mouth 3 (three) times daily. 03/17/19   Wieters, Hallie C, PA-C  ipratropium (ATROVENT ) 0.03 % nasal spray Place 2 sprays into both nostrils every 12 (twelve) hours. 03/16/24   Moishe Chiquita HERO, NP    Allergies: Patient has no known allergies.    Review of Systems  Constitutional:  Positive for fever.    Updated Vital Signs BP (!) 140/85    Pulse 72   Temp 98.3 F (36.8 C)   Resp 20   Ht 5' 7 (1.702 m)   Wt 117.9 kg   SpO2 98%   BMI 40.72 kg/m   Physical Exam Vitals and nursing note reviewed.  Constitutional:      General: He is not in acute distress.    Appearance: He is well-developed.  HENT:     Head: Normocephalic and atraumatic.     Mouth/Throat:      Comments: Actively draining periapical abscess, no trismus, pooling of secretions, abnormal phonation, facial or submandibular swelling, uvula is midline. Eyes:     Conjunctiva/sclera: Conjunctivae normal.  Cardiovascular:     Rate and Rhythm: Normal rate and regular rhythm.     Heart sounds: No murmur heard. Pulmonary:     Effort: Pulmonary effort is normal. No respiratory distress.     Breath sounds: Normal breath sounds.  Abdominal:     Palpations: Abdomen is soft.     Comments: Approximately 1 cm area of induration, tenderness, surrounding erythema and warmth on the right lower quadrant.  Abdomen is soft and nondistended  Musculoskeletal:        General: No swelling.     Cervical back: Neck supple.  Skin:    General: Skin is warm and dry.     Capillary Refill: Capillary refill takes less than 2 seconds.  Neurological:     Mental Status: He is  alert.  Psychiatric:        Mood and Affect: Mood normal.     (all labs ordered are listed, but only abnormal results are displayed) Labs Reviewed - No data to display  EKG: None  Radiology: No results found.   .Incision and Drainage  Date/Time: 06/25/2024 11:37 AM  Performed by: Donnajean Lynwood DEL, PA-C Authorized by: Donnajean Lynwood DEL, PA-C   Consent:    Consent obtained:  Verbal and written   Consent given by:  Patient   Risks discussed:  Bleeding   Alternatives discussed:  No treatment Universal protocol:    Procedure explained and questions answered to patient or proxy's satisfaction: yes     Patient identity confirmed:  Verbally with patient and arm band Location:    Type:   Abscess Pre-procedure details:    Skin preparation:  Povidone-iodine Sedation:    Sedation type:  None Anesthesia:    Anesthesia method:  Local infiltration   Local anesthetic:  5 mL lidocaine  (PF) 1 % Procedure details:    Drainage:  Serosanguinous   Drainage amount:  Scant   Wound treatment:  Wound left open   Packing materials:  None Post-procedure details:    Procedure completion:  Tolerated    Medications Ordered in the ED  lidocaine  (PF) (XYLOCAINE ) 1 % injection 5 mL (5 mLs Infiltration Given by Other 06/25/24 1136)  acetaminophen  (TYLENOL ) tablet 1,000 mg (1,000 mg Oral Given 06/25/24 1055)    Clinical Course as of 06/25/24 1139  Thu Jun 25, 2024  1048 Otherwise healthy patient evaluated for concern for possible abscess on his abdomen as well as dental pain over the past several days.  On arrival patient is hemodynamically stable and nontoxic-appearing.  He has no systemic symptoms.  On exam he has a 1 cm area of induration on his abdomen with surrounding erythema, warmth and associated tenderness.  His ENT exam is notable for what appears to be a actively draining periapical abscess.  Airway is patent, uvula is midline, no evidence of deep space infection.  Given his dental abscess is currently draining.  No further I&D indicated.  Regarding his abdomen.  There is approximately 0.5 to 1 cm fluid collection on POCUS.  Given the size of fluid collection offered discharge with antibiotics and observation without I&D versus complete I&D here in the ED with antibiotics.  Patient would prefer the latter.  Will perform and discharge on Augmentin  to cover both abscesses.  Strict return precautions provided.  Patient is understanding and agreeable to plan. [JT]    Clinical Course User Index [JT] Donnajean Lynwood DEL, PA-C                                 Medical Decision Making Risk OTC drugs. Prescription drug management.   This patient presents to the ED with chief complaint(s) of  abscess .  The complaint involves an extensive differential diagnosis and also carries with it a high risk of complications and morbidity.   Pertinent past medical history as listed in HPI  The differential diagnosis includes  Cellulitis, abscess, cyst, dental abscess, Ludwig's angina, peritonsillar or retropharyngeal abscess Additional history obtained: Records reviewed Care Everywhere/External Records  Disposition:   Patient will be discharged home. The patient has been appropriately medically screened and/or stabilized in the ED. I have low suspicion for any other emergent medical condition which would require further screening, evaluation or treatment in the  ED or require inpatient management. At time of discharge the patient is hemodynamically stable and in no acute distress. I have discussed work-up results and diagnosis with patient and answered all questions. Patient is agreeable with discharge plan. We discussed strict return precautions for returning to the emergency department and they verbalized understanding.     Social Determinants of Health:   none  This note was dictated with voice recognition software.  Despite best efforts at proofreading, errors may have occurred which can change the documentation meaning.       Final diagnoses:  Dental abscess  Abscess    ED Discharge Orders          Ordered    amoxicillin -clavulanate (AUGMENTIN ) 875-125 MG tablet  Every 12 hours        06/25/24 1138               Donnajean Lynwood DEL, PA-C 06/25/24 1139    Kingsley, Victoria K, DO 06/25/24 1457  "
